# Patient Record
Sex: Female | Born: 1950 | Race: White | Hispanic: No | Marital: Single | Smoking: Never smoker
Health system: Southern US, Community
[De-identification: ages and names within clinical notes are randomized; demographics above are authoritative.]

## PROBLEM LIST (undated history)

## (undated) DIAGNOSIS — R112 Nausea with vomiting, unspecified: Secondary | ICD-10-CM

## (undated) DIAGNOSIS — K219 Gastro-esophageal reflux disease without esophagitis: Secondary | ICD-10-CM

## (undated) DIAGNOSIS — Z9889 Other specified postprocedural states: Secondary | ICD-10-CM

## (undated) DIAGNOSIS — C50919 Malignant neoplasm of unspecified site of unspecified female breast: Secondary | ICD-10-CM

## (undated) DIAGNOSIS — M1990 Unspecified osteoarthritis, unspecified site: Secondary | ICD-10-CM

## (undated) DIAGNOSIS — Z8489 Family history of other specified conditions: Secondary | ICD-10-CM

## (undated) HISTORY — PX: COLONOSCOPY: SHX174

## (undated) HISTORY — PX: OTHER SURGICAL HISTORY: SHX169

---
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
## 2000-02-11 DIAGNOSIS — C50919 Malignant neoplasm of unspecified site of unspecified female breast: Secondary | ICD-10-CM

## 2000-02-11 HISTORY — PX: MASTECTOMY: SHX3

## 2000-02-11 HISTORY — DX: Malignant neoplasm of unspecified site of unspecified female breast: C50.919

## 2002-02-10 HISTORY — PX: OOPHORECTOMY: SHX86

## 2002-08-25 ENCOUNTER — Ambulatory Visit (HOSPITAL_COMMUNITY): Admission: RE | Admit: 2002-08-25 | Discharge: 2002-08-25 | Payer: Self-pay | Admitting: *Deleted

## 2004-03-19 ENCOUNTER — Other Ambulatory Visit: Admission: RE | Admit: 2004-03-19 | Discharge: 2004-03-19 | Payer: Self-pay | Admitting: Obstetrics and Gynecology

## 2011-06-04 ENCOUNTER — Emergency Department (INDEPENDENT_AMBULATORY_CARE_PROVIDER_SITE_OTHER): Payer: BC Managed Care – PPO

## 2011-06-04 ENCOUNTER — Emergency Department (HOSPITAL_BASED_OUTPATIENT_CLINIC_OR_DEPARTMENT_OTHER)
Admission: EM | Admit: 2011-06-04 | Discharge: 2011-06-04 | Disposition: A | Discharge status: Home / Self Care | Payer: BC Managed Care – PPO | Attending: Emergency Medicine | Admitting: Emergency Medicine

## 2011-06-04 ENCOUNTER — Encounter (HOSPITAL_BASED_OUTPATIENT_CLINIC_OR_DEPARTMENT_OTHER): Payer: Self-pay | Admitting: Emergency Medicine

## 2011-06-04 DIAGNOSIS — W219XXA Striking against or struck by unspecified sports equipment, initial encounter: Secondary | ICD-10-CM | POA: Insufficient documentation

## 2011-06-04 DIAGNOSIS — Z853 Personal history of malignant neoplasm of breast: Secondary | ICD-10-CM | POA: Insufficient documentation

## 2011-06-04 DIAGNOSIS — Z79899 Other long term (current) drug therapy: Secondary | ICD-10-CM | POA: Insufficient documentation

## 2011-06-04 DIAGNOSIS — S8990XA Unspecified injury of unspecified lower leg, initial encounter: Secondary | ICD-10-CM

## 2011-06-04 DIAGNOSIS — S8010XA Contusion of unspecified lower leg, initial encounter: Secondary | ICD-10-CM

## 2011-06-04 DIAGNOSIS — Y9364 Activity, baseball: Secondary | ICD-10-CM | POA: Insufficient documentation

## 2011-06-04 DIAGNOSIS — S99919A Unspecified injury of unspecified ankle, initial encounter: Secondary | ICD-10-CM

## 2011-06-04 DIAGNOSIS — X58XXXA Exposure to other specified factors, initial encounter: Secondary | ICD-10-CM

## 2011-06-04 DIAGNOSIS — M7989 Other specified soft tissue disorders: Secondary | ICD-10-CM

## 2011-06-04 DIAGNOSIS — Y9239 Other specified sports and athletic area as the place of occurrence of the external cause: Secondary | ICD-10-CM | POA: Insufficient documentation

## 2011-06-04 HISTORY — DX: Malignant neoplasm of unspecified site of unspecified female breast: C50.919

## 2011-06-04 NOTE — ED Notes (Signed)
Pt has right lower leg pain after being hit with a hit soft ball in shin. Pt has had ice on leg

## 2011-06-04 NOTE — Discharge Instructions (Signed)
Contusion A contusion is a deep bruise. Contusions happen when an injury causes bleeding under the skin. Signs of bruising include pain, puffiness (swelling), and discolored skin. The contusion may turn blue, purple, or yellow. HOME CARE   Put ice on the injured area.   Put ice in a plastic bag.   Place a towel between your skin and the bag.   Leave the ice on for 15 to 20 minutes, 3 to 4 times a day.   Only take medicine as told by your doctor.   Rest the injured area.   If possible, raise (elevate) the injured area to lessen puffiness.  GET HELP RIGHT AWAY IF:   You have more bruising or puffiness.   You have pain that is getting worse.   Your puffiness or pain is not helped by medicine.  MAKE SURE YOU:   Understand these instructions.   Will watch your condition.   Will get help right away if you are not doing well or get worse.  Document Released: 07/16/2007 Document Revised: 01/16/2011 Document Reviewed: 12/02/2010 Exit[employer] Patient Information 2012 [employer].Hematoma A hematoma is a pocket of blood that collects under the skin, in an organ, in a body space, in a joint space, or in other tissue. The blood can clot to form a lump that you can see and feel. The lump is often firm, sore, and sometimes even painful and tender. Most hematomas get better in a few days to weeks. However, some hematomas may be serious and require medical care.Hematomas can range in size from very small to very large. CAUSES  A hematoma can be caused by a blunt or penetrating injury. It can also be caused by leakage from a blood vessel under the skin. Spontaneous leakage from a blood vessel is more likely to occur in elderly people, especially those taking blood thinners. Sometimes, a hematoma can develop after certain medical procedures. SYMPTOMS  Unlike a bruise, a hematoma forms a firm lump that you can feel. This lump is the collection of blood. The collection of blood can also cause your  skin to turn a blue to dark blue color. If the hematoma is close to the surface of the skin, it often produces a yellowish color in the skin. DIAGNOSIS  Your caregiver can determine whether you have a hematoma based on your history and a physical exam. TREATMENT  Hematomas usually go away on their own over time. Rarely does the blood need to be drained out of the body. HOME CARE INSTRUCTIONS   Put ice on the injured area.   Put ice in a plastic bag.   Place a towel between your skin and the bag.   Leave the ice on for 15 to 20 minutes, 3 to 4 times a day for the first 1 to 2 days.   After the first 2 days, switch to using warm compresses on the hematoma.   Elevate the injured area to help decrease pain and swelling. Wrapping the area with an elastic bandage may also be helpful. Compression helps to reduce swelling and promotes shrinking of the hematoma. Make sure the bandage is not wrapped too tight.   If your hematoma is on a lower extremity and is painful, crutches may be helpful for a couple days.   Only take over-the-counter or prescription medicines for pain, discomfort, or fever as directed by your caregiver. Most patients can take acetaminophen or ibuprofen for the pain.  SEEK IMMEDIATE MEDICAL CARE IF:   You   have increasing pain, or your pain is not controlled with medicine.   You have a fever.   You have worsening swelling or discoloration.   Your skin over the hematoma breaks or starts bleeding.  MAKE SURE YOU:   Understand these instructions.   Will watch your condition.   Will get help right away if you are not doing well or get worse.  Document Released: 09/11/2003 Document Revised: 01/16/2011 Document Reviewed: 09/30/2010 Exit[employer] Patient Information 2012 [employer]. 

## 2011-06-04 NOTE — ED Notes (Signed)
Playing softball this afternoon, was pitching and line drive hit her right shin, +dp, worried about getting blood clot

## 2011-06-04 NOTE — ED Provider Notes (Signed)
History     CSN: [id number]  Arrival date & time 06/04/11  2013   None     Chief Complaint  Patient presents with  . Leg Pain    (Consider location/radiation/quality/duration/timing/severity/associated sxs/prior treatment) Patient is a 61 y.o. female presenting with leg pain. The history is provided by the patient. No language interpreter was used.  Leg Pain  The incident occurred 1 to 2 hours ago. [place]. The injury mechanism was a direct blow. The pain is present in the right leg. The quality of the pain is described as aching. The pain is at a severity of 3/10. The pain is mild. The pain has been constant since onset. Pertinent negatives include no numbness and no loss of motion. She reports no foreign bodies present. The symptoms are aggravated by nothing. She has tried ice for the symptoms.   Pt was hit in right lower leg with a softball.  Pt complains of bruising and pain.  (Pt worried she could have a blood clot)  Past Medical History  Diagnosis Date  . Breast CA     Past Surgical History  Procedure Date  . Mastectomy   . Knee surgery   . Oophorectomy     No family history on file.  History  Substance Use Topics  . Smoking status: Never Smoker   . Smokeless tobacco: Not on file  . Alcohol Use: No    OB History    Grav Para Term Preterm Abortions TAB SAB Ect Mult Living                  Review of Systems  Musculoskeletal: Positive for myalgias and joint swelling.  Neurological: Negative for numbness.  All other systems reviewed and are negative.    Allergies  Codeine and Tape  Home Medications   Current Outpatient Rx  Name Route Sig Dispense Refill  . ASPIRIN-ACETAMINOPHEN-CAFFEINE 250-250-65 MG PO TABS Oral Take 1 tablet by mouth every 6 (six) hours as needed. Patient used this medication for her headache and knee pain.    Marland Kitchen CALCIUM + D PO Oral Take 1 tablet by mouth daily.    Marland Kitchen CALCIUM-VITAMIN D PO Oral Take 1 tablet by  mouth daily.    . CELECOXIB 50 MG PO CAPS Oral Take 50 mg by mouth 2 (two) times daily.    . IBUPROFEN 200 MG PO TABS Oral Take 200 mg by mouth every 6 (six) hours as needed. Patient used this medication for her knee.    Marland Kitchen PRILOSEC PO Oral Take 1 tablet by mouth daily.      BP 132/76  Pulse 78  Temp(Src) 98.3 F (36.8 C) (Oral)  Resp 18  Ht 5\' 8"  (1.727 m)  Wt 190 lb (86.183 kg)  BMI 28.89 kg/m2  SpO2 98%  Physical Exam  Nursing note and vitals reviewed. Constitutional: She is oriented to person, place, and time. She appears well-developed and well-nourished.  HENT:  Head: Normocephalic and atraumatic.  Eyes: Pupils are equal, round, and reactive to light.  Musculoskeletal: Normal range of motion. She exhibits tenderness.       10x15 cm bruised swollen area right lower leg,  From,  nv and ns intact  Neurological: She is alert and oriented to person, place, and time.  Skin: Skin is warm.  Psychiatric: She has a normal mood and affect.    ED Course  Procedures (including critical care time)  Labs Reviewed - No data to display  Dg Tibia/fibula Right  06/04/2011  *RADIOLOGY REPORT*  Clinical Data: Blunt trauma to the lower extremity.  Swelling and bruising.  RIGHT TIBIA AND FIBULA - 2 VIEW  Comparison: None.  Findings: Soft tissue swelling is present over the proximal tibia anteriorly.  There is no underlying fracture.  No radiopaque foreign body is evident.  Advanced degenerative changes are noted in the knee.  The suprapatellar bursa is not imaged and I therefore cannot describe if there is an effusion.  The ankle is located.  IMPRESSION:  1.  Soft tissue swelling over the anterior proximal tibia likely represents a hematoma without underlying fracture. 2.  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  Original Report Authenticated By: Jamesetta Orleans. MATTERN, M.D.     No diagnosis found.    MDM  Pt placed in an ace wrap.  Pt advised ice.  Return if any  problems.          Lonia Skinner [place], [place] 06/04/11 2231

## 2011-06-05 NOTE — ED Provider Notes (Signed)
Medical screening examination/treatment/procedure(s) were performed by non-physician practitioner and as supervising physician I was immediately available for consultation/collaboration.   Carleene Cooper III, MD 06/05/11 1153

## 2014-10-06 ENCOUNTER — Other Ambulatory Visit (HOSPITAL_COMMUNITY): Payer: Self-pay | Admitting: Orthopaedic Surgery

## 2014-10-10 ENCOUNTER — Other Ambulatory Visit (HOSPITAL_COMMUNITY): Payer: Self-pay | Admitting: *Deleted

## 2014-10-10 NOTE — Patient Instructions (Addendum)
Lindsay Jimenez  10/10/2014   Your procedure is scheduled on: Friday September 9th, 2016  Report to [facility]  Entrance take [place]  elevators to 3rd floor to  [name] at 750 AM.  Call this number if you have problems the morning of surgery [phone]   Remember: ONLY 1 PERSON MAY GO WITH YOU TO SHORT STAY TO GET  READY MORNING OF [name].  Do not eat food or drink liquids :After Midnight.     Take these medicines the morning of surgery with A SIP OF WATER flonase nasal spray, eye drop if needed                               You may not have any metal on your body including hair pins and              piercings  Do not wear jewelry, make-up, lotions, powders or perfumes, deodorant             Do not wear nail polish.  Do not shave  48 hours prior to surgery.              Men may shave face and neck.   Do not bring valuables to the hospital. [name].  Contacts, dentures or bridgework may not be worn into surgery.  Leave suitcase in the car. After surgery it may be brought to your room.     Patients discharged the day of surgery will not be allowed to drive home.  Name and phone number of your driver:  Special Instructions: N/A              Please read over the following fact sheets you were given: _____________________________________________________________________             [name] - Preparing for Surgery Before surgery, you can play an important role.  Because skin is not sterile, your skin needs to be as free of germs as possible.  You can reduce the number of germs on your skin by washing with CHG (chlorahexidine gluconate) soap before surgery.  CHG is an antiseptic cleaner which kills germs and bonds with the skin to continue killing germs even after washing. Please DO NOT use if you have an allergy to CHG or antibacterial soaps.  If your skin becomes reddened/irritated stop  using the CHG and inform your nurse when you arrive at Short Stay. Do not shave (including legs and underarms) for at least 48 hours prior to the first CHG shower.  You may shave your face/neck. Please follow these instructions carefully:  1.  Shower with CHG Soap the night before surgery and the  morning of Surgery.  2.  If you choose to wash your hair, wash your hair first as usual with your  normal  shampoo.  3.  After you shampoo, rinse your hair and body thoroughly to remove the  shampoo.                           4.  Use CHG as you would any other liquid soap.  You can apply chg directly  to the skin and wash  Gently with a scrungie or clean washcloth.  5.  Apply the CHG Soap to your body ONLY FROM THE NECK DOWN.   Do not use on face/ open                           Wound or open sores. Avoid contact with eyes, ears mouth and genitals (private parts).                       Wash face,  Genitals (private parts) with your normal soap.             6.  Wash thoroughly, paying special attention to the area where your surgery  will be performed.  7.  Thoroughly rinse your body with warm water from the neck down.  8.  DO NOT shower/wash with your normal soap after using and rinsing off  the CHG Soap.                9.  Pat yourself dry with a clean towel.            10.  Wear clean pajamas.            11.  Place clean sheets on your bed the night of your first shower and do not  sleep with pets. Day of Surgery : Do not apply any lotions/deodorants the morning of surgery.  Please wear clean clothes to the hospital/surgery center.  FAILURE TO FOLLOW THESE INSTRUCTIONS MAY RESULT IN THE CANCELLATION OF YOUR SURGERY PATIENT SIGNATURE_________________________________  NURSE SIGNATURE__________________________________  ________________________________________________________________________  WHAT IS A BLOOD TRANSFUSION? Blood Transfusion Information  A transfusion is the  replacement of blood or some of its parts. Blood is made up of multiple cells which provide different functions.  Red blood cells carry oxygen and are used for blood loss replacement.  White blood cells fight against infection.  Platelets control bleeding.  Plasma helps clot blood.  Other blood products are available for specialized needs, such as hemophilia or other clotting disorders. BEFORE THE TRANSFUSION  Who gives blood for transfusions?   Healthy volunteers who are fully evaluated to make sure their blood is safe. This is blood bank blood. Transfusion therapy is the safest it has ever been in the practice of medicine. Before blood is taken from a donor, a complete history is taken to make sure that person has no history of diseases nor engages in risky social behavior (examples are intravenous drug use or sexual activity with multiple partners). The donor's travel history is screened to minimize risk of transmitting infections, such as malaria. The donated blood is tested for signs of infectious diseases, such as HIV and hepatitis. The blood is then tested to be sure it is compatible with you in order to minimize the chance of a transfusion reaction. If you or a relative donates blood, this is often done in anticipation of surgery and is not appropriate for emergency situations. It takes many days to process the donated blood. RISKS AND COMPLICATIONS Although transfusion therapy is very safe and saves many lives, the main dangers of transfusion include:   Getting an infectious disease.  Developing a transfusion reaction. This is an allergic reaction to something in the blood you were given. Every precaution is taken to prevent this. The decision to have a blood transfusion has been considered carefully by your caregiver before blood is given. Blood is not given unless the benefits outweigh  the risks. AFTER THE TRANSFUSION  Right after receiving a blood transfusion, you will usually  feel much better and more energetic. This is especially true if your red blood cells have gotten low (anemic). The transfusion raises the level of the red blood cells which carry oxygen, and this usually causes an energy increase.  The nurse administering the transfusion will monitor you carefully for complications. HOME CARE INSTRUCTIONS  No special instructions are needed after a transfusion. You may find your energy is better. Speak with your caregiver about any limitations on activity for underlying diseases you may have. SEEK MEDICAL CARE IF:   Your condition is not improving after your transfusion.  You develop redness or irritation at the intravenous (IV) site. SEEK IMMEDIATE MEDICAL CARE IF:  Any of the following symptoms occur over the next 12 hours:  Shaking chills.  You have a temperature by mouth above 102 F (38.9 C), not controlled by medicine.  Chest, back, or muscle pain.  People around you feel you are not acting correctly or are confused.  Shortness of breath or difficulty breathing.  Dizziness and fainting.  You get a rash or develop hives.  You have a decrease in urine output.  Your urine turns a dark color or changes to pink, red, or brown. Any of the following symptoms occur over the next 10 days:  You have a temperature by mouth above 102 F (38.9 C), not controlled by medicine.  Shortness of breath.  Weakness after normal activity.  The white part of the eye turns yellow (jaundice).  You have a decrease in the amount of urine or are urinating less often.  Your urine turns a dark color or changes to pink, red, or brown. Document Released: 01/25/2000 Document Revised: 04/21/2011 Document Reviewed: 09/13/2007 [facility] Patient Information 2014 [place], [place].  _______________________________________________________________________

## 2014-10-11 ENCOUNTER — Encounter (HOSPITAL_COMMUNITY): Payer: Self-pay

## 2014-10-11 ENCOUNTER — Encounter (HOSPITAL_COMMUNITY)
Admission: RE | Admit: 2014-10-11 | Discharge: 2014-10-11 | Disposition: A | Discharge status: Home / Self Care | Payer: BC Managed Care – PPO | Source: Ambulatory Visit | Attending: Orthopaedic Surgery | Admitting: Orthopaedic Surgery

## 2014-10-11 DIAGNOSIS — Z01818 Encounter for other preprocedural examination: Secondary | ICD-10-CM | POA: Insufficient documentation

## 2014-10-11 DIAGNOSIS — M1611 Unilateral primary osteoarthritis, right hip: Secondary | ICD-10-CM | POA: Insufficient documentation

## 2014-10-11 HISTORY — DX: Other specified postprocedural states: Z98.890

## 2014-10-11 HISTORY — DX: Gastro-esophageal reflux disease without esophagitis: K21.9

## 2014-10-11 HISTORY — DX: Other specified postprocedural states: R11.2

## 2014-10-11 HISTORY — DX: Family history of other specified conditions: Z84.89

## 2014-10-11 HISTORY — DX: Unspecified osteoarthritis, unspecified site: M19.90

## 2014-10-11 LAB — CBC
HEMATOCRIT: 42.6 % (ref 36.0–46.0)
Hemoglobin: 14.1 g/dL (ref 12.0–15.0)
MCH: 29.6 pg (ref 26.0–34.0)
MCHC: 33.1 g/dL (ref 30.0–36.0)
MCV: 89.3 fL (ref 78.0–100.0)
Platelets: 285 10*3/uL (ref 150–400)
RBC: 4.77 MIL/uL (ref 3.87–5.11)
RDW: 13.1 % (ref 11.5–15.5)
WBC: 8.6 10*3/uL (ref 4.0–10.5)

## 2014-10-11 LAB — BASIC METABOLIC PANEL
Anion gap: 4 — ABNORMAL LOW (ref 5–15)
BUN: 20 mg/dL (ref 6–20)
CO2: 32 mmol/L (ref 22–32)
CREATININE: 0.81 mg/dL (ref 0.44–1.00)
Calcium: 9.9 mg/dL (ref 8.9–10.3)
Chloride: 102 mmol/L (ref 101–111)
GFR calc Af Amer: >60 mL/min (ref >60)
GLUCOSE: 88 mg/dL (ref 65–99)
POTASSIUM: 5.2 mmol/L — AB (ref 3.5–5.1)
Sodium: 138 mmol/L (ref 135–145)

## 2014-10-11 LAB — PROTIME-INR
INR: 0.98 (ref 0.00–1.49)
PROTHROMBIN TIME: 13.2 s (ref 11.6–15.2)

## 2014-10-11 LAB — ABO/RH: ABO/RH(D): O POS

## 2014-10-11 LAB — APTT: aPTT: 29 seconds (ref 24–37)

## 2014-10-11 LAB — SURGICAL PCR SCREEN
MRSA, PCR: NEGATIVE
Staphylococcus aureus: NEGATIVE

## 2014-10-19 LAB — TYPE AND SCREEN
ABO/RH(D): O POS
Antibody Screen: NEGATIVE

## 2014-10-20 ENCOUNTER — Inpatient Hospital Stay (HOSPITAL_COMMUNITY): Payer: BC Managed Care – PPO

## 2014-10-20 ENCOUNTER — Inpatient Hospital Stay (HOSPITAL_COMMUNITY)
Admission: RE | Admit: 2014-10-20 | Discharge: 2014-10-21 | DRG: 470 | Disposition: A | Discharge status: Home Health | Payer: BC Managed Care – PPO | Source: Ambulatory Visit | Attending: Orthopaedic Surgery | Admitting: Orthopaedic Surgery

## 2014-10-20 ENCOUNTER — Encounter (HOSPITAL_COMMUNITY): Payer: Self-pay | Admitting: *Deleted

## 2014-10-20 ENCOUNTER — Inpatient Hospital Stay (HOSPITAL_COMMUNITY): Payer: BC Managed Care – PPO | Admitting: Certified Registered Nurse Anesthetist

## 2014-10-20 ENCOUNTER — Encounter (HOSPITAL_COMMUNITY)
Admission: RE | Disposition: A | Discharge status: Home Health | Payer: Self-pay | Source: Ambulatory Visit | Attending: Orthopaedic Surgery

## 2014-10-20 DIAGNOSIS — Z9010 Acquired absence of unspecified breast and nipple: Secondary | ICD-10-CM | POA: Diagnosis present

## 2014-10-20 DIAGNOSIS — M25551 Pain in right hip: Secondary | ICD-10-CM | POA: Diagnosis present

## 2014-10-20 DIAGNOSIS — Z96641 Presence of right artificial hip joint: Secondary | ICD-10-CM

## 2014-10-20 DIAGNOSIS — M1611 Unilateral primary osteoarthritis, right hip: Principal | ICD-10-CM

## 2014-10-20 DIAGNOSIS — Z853 Personal history of malignant neoplasm of breast: Secondary | ICD-10-CM | POA: Diagnosis not present

## 2014-10-20 DIAGNOSIS — Z01812 Encounter for preprocedural laboratory examination: Secondary | ICD-10-CM

## 2014-10-20 DIAGNOSIS — Z419 Encounter for procedure for purposes other than remedying health state, unspecified: Secondary | ICD-10-CM

## 2014-10-20 DIAGNOSIS — K219 Gastro-esophageal reflux disease without esophagitis: Secondary | ICD-10-CM | POA: Diagnosis present

## 2014-10-20 HISTORY — PX: TOTAL HIP ARTHROPLASTY: SHX124

## 2014-10-20 SURGERY — ARTHROPLASTY, HIP, TOTAL, ANTERIOR APPROACH
Anesthesia: Monitor Anesthesia Care | Site: Hip | Laterality: Right

## 2014-10-20 MED ORDER — SODIUM CHLORIDE 0.9 % IV SOLN: INTRAVENOUS | Status: DC

## 2014-10-20 MED ORDER — POLYETHYLENE GLYCOL 3350 17 G PO PACK: 17.0000 g | PACK | Freq: Every day | ORAL | Status: DC | PRN

## 2014-10-20 MED ORDER — ONDANSETRON HCL 4 MG PO TABS: 4.0000 mg | ORAL_TABLET | Freq: Four times a day (QID) | ORAL | Status: DC | PRN

## 2014-10-20 MED ORDER — METOCLOPRAMIDE HCL 5 MG/ML IJ SOLN: 5.0000 mg | Freq: Three times a day (TID) | INTRAMUSCULAR | Status: DC | PRN

## 2014-10-20 MED ORDER — PROPOFOL 10 MG/ML IV BOLUS
INTRAVENOUS | Status: AC
  Filled 2014-10-20: qty 20

## 2014-10-20 MED ORDER — OXYCODONE HCL 5 MG PO TABS: 5.0000 mg | ORAL_TABLET | Freq: Once | ORAL | Status: DC | PRN

## 2014-10-20 MED ORDER — ACETAMINOPHEN 325 MG PO TABS: 650.0000 mg | ORAL_TABLET | Freq: Four times a day (QID) | ORAL | Status: DC | PRN

## 2014-10-20 MED ORDER — METHOCARBAMOL 500 MG PO TABS
500.0000 mg | ORAL_TABLET | Freq: Four times a day (QID) | ORAL | Status: DC | PRN
  Administered 2014-10-21: 500 mg via ORAL
  Filled 2014-10-20: qty 1

## 2014-10-20 MED ORDER — CEFAZOLIN SODIUM-DEXTROSE 2-3 GM-% IV SOLR
2.0000 g | INTRAVENOUS | Status: AC
  Administered 2014-10-20: 2 g via INTRAVENOUS

## 2014-10-20 MED ORDER — SODIUM CHLORIDE 0.9 % IR SOLN
Status: DC | PRN
  Administered 2014-10-20: 1000 mL

## 2014-10-20 MED ORDER — DIPHENHYDRAMINE HCL 12.5 MG/5ML PO ELIX: 12.5000 mg | ORAL_SOLUTION | ORAL | Status: DC | PRN

## 2014-10-20 MED ORDER — ONDANSETRON HCL 4 MG/2ML IJ SOLN: 4.0000 mg | Freq: Four times a day (QID) | INTRAMUSCULAR | Status: DC | PRN

## 2014-10-20 MED ORDER — ASPIRIN EC 325 MG PO TBEC
325.0000 mg | DELAYED_RELEASE_TABLET | Freq: Two times a day (BID) | ORAL | Status: DC
  Administered 2014-10-21: 325 mg via ORAL
  Filled 2014-10-20 (×3): qty 1

## 2014-10-20 MED ORDER — 0.9 % SODIUM CHLORIDE (POUR BTL) OPTIME
TOPICAL | Status: DC | PRN
  Administered 2014-10-20: 1000 mL

## 2014-10-20 MED ORDER — FENTANYL CITRATE (PF) 100 MCG/2ML IJ SOLN
INTRAMUSCULAR | Status: DC | PRN
  Administered 2014-10-20 (×2): 50 ug via INTRAVENOUS

## 2014-10-20 MED ORDER — MIDAZOLAM HCL 5 MG/5ML IJ SOLN
INTRAMUSCULAR | Status: DC | PRN
  Administered 2014-10-20: 2 mg via INTRAVENOUS

## 2014-10-20 MED ORDER — HYDROMORPHONE HCL 1 MG/ML IJ SOLN
1.0000 mg | INTRAMUSCULAR | Status: DC | PRN
Start: 2014-10-20 — End: 2014-10-21

## 2014-10-20 MED ORDER — ACETAMINOPHEN 650 MG RE SUPP: 650.0000 mg | Freq: Four times a day (QID) | RECTAL | Status: DC | PRN

## 2014-10-20 MED ORDER — ONDANSETRON HCL 4 MG/2ML IJ SOLN
INTRAMUSCULAR | Status: DC | PRN
  Administered 2014-10-20: 4 mg via INTRAVENOUS

## 2014-10-20 MED ORDER — CEFAZOLIN SODIUM 1-5 GM-% IV SOLN
1.0000 g | Freq: Four times a day (QID) | INTRAVENOUS | Status: AC
  Administered 2014-10-20 (×2): 1 g via INTRAVENOUS
  Filled 2014-10-20 (×2): qty 50

## 2014-10-20 MED ORDER — HYDROMORPHONE HCL 1 MG/ML IJ SOLN
0.2500 mg | INTRAMUSCULAR | Status: DC | PRN
  Administered 2014-10-20 (×4): 0.5 mg via INTRAVENOUS

## 2014-10-20 MED ORDER — METHOCARBAMOL 1000 MG/10ML IJ SOLN
500.0000 mg | Freq: Four times a day (QID) | INTRAVENOUS | Status: DC | PRN
  Administered 2014-10-20 (×2): 500 mg via INTRAVENOUS
  Filled 2014-10-20 (×3): qty 5

## 2014-10-20 MED ORDER — MENTHOL 3 MG MT LOZG
1.0000 | LOZENGE | OROMUCOSAL | Status: DC | PRN
  Filled 2014-10-20: qty 9

## 2014-10-20 MED ORDER — HYDROMORPHONE HCL 1 MG/ML IJ SOLN
0.2500 mg | INTRAMUSCULAR | Status: DC | PRN
  Administered 2014-10-20: 0.5 mg via INTRAVENOUS

## 2014-10-20 MED ORDER — BUPIVACAINE HCL (PF) 0.5 % IJ SOLN
INTRAMUSCULAR | Status: DC | PRN
  Administered 2014-10-20: 3 mL

## 2014-10-20 MED ORDER — TRAMADOL HCL 50 MG PO TABS: 100.0000 mg | ORAL_TABLET | Freq: Four times a day (QID) | ORAL | Status: DC | PRN

## 2014-10-20 MED ORDER — LACTATED RINGERS IV SOLN
INTRAVENOUS | Status: DC
  Administered 2014-10-20: 11:00:00 via INTRAVENOUS
  Administered 2014-10-20: 1000 mL via INTRAVENOUS

## 2014-10-20 MED ORDER — DEXAMETHASONE SODIUM PHOSPHATE 10 MG/ML IJ SOLN
INTRAMUSCULAR | Status: DC | PRN
  Administered 2014-10-20: 10 mg via INTRAVENOUS

## 2014-10-20 MED ORDER — KETAMINE HCL 10 MG/ML IJ SOLN
INTRAMUSCULAR | Status: DC | PRN
  Administered 2014-10-20: 10 mg via INTRAVENOUS
  Administered 2014-10-20: 20 mg via INTRAVENOUS
  Administered 2014-10-20: 10 mg via INTRAVENOUS
  Administered 2014-10-20: 20 mg via INTRAVENOUS

## 2014-10-20 MED ORDER — BUPIVACAINE HCL (PF) 0.5 % IJ SOLN
INTRAMUSCULAR | Status: AC
  Filled 2014-10-20: qty 30

## 2014-10-20 MED ORDER — PROPOFOL 10 MG/ML IV BOLUS
INTRAVENOUS | Status: DC | PRN
  Administered 2014-10-20: 20 mg via INTRAVENOUS

## 2014-10-20 MED ORDER — PHENOL 1.4 % MT LIQD
1.0000 | OROMUCOSAL | Status: DC | PRN
  Filled 2014-10-20: qty 177

## 2014-10-20 MED ORDER — ONDANSETRON HCL 4 MG/2ML IJ SOLN
INTRAMUSCULAR | Status: AC
  Filled 2014-10-20: qty 2

## 2014-10-20 MED ORDER — DOCUSATE SODIUM 100 MG PO CAPS
100.0000 mg | ORAL_CAPSULE | Freq: Two times a day (BID) | ORAL | Status: DC
  Administered 2014-10-20 – 2014-10-21 (×2): 100 mg via ORAL
  Filled 2014-10-20 (×2): qty 1

## 2014-10-20 MED ORDER — KETAMINE HCL 10 MG/ML IJ SOLN
INTRAMUSCULAR | Status: AC
  Filled 2014-10-20: qty 1

## 2014-10-20 MED ORDER — OXYCODONE HCL 5 MG PO TABS
5.0000 mg | ORAL_TABLET | ORAL | Status: DC | PRN
  Administered 2014-10-20 – 2014-10-21 (×2): 5 mg via ORAL
  Filled 2014-10-20 (×3): qty 1

## 2014-10-20 MED ORDER — PHENYLEPHRINE 40 MCG/ML (10ML) SYRINGE FOR IV PUSH (FOR BLOOD PRESSURE SUPPORT)
PREFILLED_SYRINGE | INTRAVENOUS | Status: AC
  Filled 2014-10-20: qty 10

## 2014-10-20 MED ORDER — MIDAZOLAM HCL 2 MG/2ML IJ SOLN
INTRAMUSCULAR | Status: AC
  Filled 2014-10-20: qty 4

## 2014-10-20 MED ORDER — ALUM & MAG HYDROXIDE-SIMETH 200-200-20 MG/5ML PO SUSP: 30.0000 mL | ORAL | Status: DC | PRN

## 2014-10-20 MED ORDER — FENTANYL CITRATE (PF) 100 MCG/2ML IJ SOLN
INTRAMUSCULAR | Status: AC
  Filled 2014-10-20: qty 4

## 2014-10-20 MED ORDER — PROPOFOL INFUSION 10 MG/ML OPTIME
INTRAVENOUS | Status: DC | PRN
  Administered 2014-10-20: 50 ug/kg/min via INTRAVENOUS

## 2014-10-20 MED ORDER — TRANEXAMIC ACID 1000 MG/10ML IV SOLN
1000.0000 mg | INTRAVENOUS | Status: AC
  Administered 2014-10-20: 1000 mg via INTRAVENOUS
  Filled 2014-10-20: qty 10

## 2014-10-20 MED ORDER — KETOROLAC TROMETHAMINE 15 MG/ML IJ SOLN
7.5000 mg | Freq: Four times a day (QID) | INTRAMUSCULAR | Status: AC
  Administered 2014-10-20 – 2014-10-21 (×4): 7.5 mg via INTRAVENOUS
  Filled 2014-10-20 (×3): qty 1

## 2014-10-20 MED ORDER — METOCLOPRAMIDE HCL 10 MG PO TABS: 5.0000 mg | ORAL_TABLET | Freq: Three times a day (TID) | ORAL | Status: DC | PRN

## 2014-10-20 MED ORDER — OXYCODONE HCL 5 MG/5ML PO SOLN
5.0000 mg | Freq: Once | ORAL | Status: DC | PRN
  Filled 2014-10-20: qty 5

## 2014-10-20 MED ORDER — HYDROMORPHONE HCL 1 MG/ML IJ SOLN
INTRAMUSCULAR | Status: AC
  Filled 2014-10-20: qty 2

## 2014-10-20 MED ORDER — KETOROLAC TROMETHAMINE 15 MG/ML IJ SOLN
INTRAMUSCULAR | Status: AC
Start: 2014-10-20 — End: 2014-10-21
  Filled 2014-10-20: qty 1

## 2014-10-20 MED ORDER — ZOLPIDEM TARTRATE 5 MG PO TABS: 5.0000 mg | ORAL_TABLET | Freq: Every evening | ORAL | Status: DC | PRN

## 2014-10-20 MED ORDER — CEFAZOLIN SODIUM-DEXTROSE 2-3 GM-% IV SOLR
INTRAVENOUS | Status: AC
  Filled 2014-10-20: qty 50

## 2014-10-20 MED ORDER — HYDROMORPHONE HCL 1 MG/ML IJ SOLN
INTRAMUSCULAR | Status: AC
  Filled 2014-10-20: qty 1

## 2014-10-20 SURGICAL SUPPLY — 45 items
APL SKNCLS STERI-STRIP NONHPOA (GAUZE/BANDAGES/DRESSINGS) ×1
BAG SPEC THK2 15X12 ZIP CLS (MISCELLANEOUS)
BAG ZIPLOCK 12X15 (MISCELLANEOUS) IMPLANT
BENZOIN TINCTURE PRP APPL 2/3 (GAUZE/BANDAGES/DRESSINGS) ×1 IMPLANT
BLADE SAW SGTL 18X1.27X75 (BLADE) ×2 IMPLANT
CAPT HIP TOTAL 2 ×1 IMPLANT
CELLS DAT CNTRL 66122 CELL SVR (MISCELLANEOUS) ×1 IMPLANT
CHLORAPREP W/TINT 26ML (MISCELLANEOUS) ×2 IMPLANT
COVER PERINEAL POST (MISCELLANEOUS) ×2 IMPLANT
DRAPE C-ARM 42X120 X-RAY (DRAPES) ×2 IMPLANT
DRAPE STERI IOBAN 125X83 (DRAPES) ×2 IMPLANT
DRAPE U-SHAPE 47X51 STRL (DRAPES) ×6 IMPLANT
DRSG AQUACEL AG ADV 3.5X10 (GAUZE/BANDAGES/DRESSINGS) ×2 IMPLANT
ELECT BLADE TIP CTD 4 INCH (ELECTRODE) ×2 IMPLANT
ELECT REM PT RETURN 9FT ADLT (ELECTROSURGICAL) ×2
ELECTRODE REM PT RTRN 9FT ADLT (ELECTROSURGICAL) ×1 IMPLANT
FACESHIELD WRAPAROUND (MASK) ×8 IMPLANT
FACESHIELD WRAPAROUND OR TEAM (MASK) ×4 IMPLANT
GAUZE XEROFORM 1X8 LF (GAUZE/BANDAGES/DRESSINGS) IMPLANT
GLOVE BIO SURGEON STRL SZ7.5 (GLOVE) ×2 IMPLANT
GLOVE BIOGEL PI IND STRL 8 (GLOVE) ×2 IMPLANT
GLOVE BIOGEL PI INDICATOR 8 (GLOVE) ×2
GLOVE ECLIPSE 8.0 STRL XLNG CF (GLOVE) ×2 IMPLANT
GOWN STRL REUS W/TWL XL LVL3 (GOWN DISPOSABLE) ×4 IMPLANT
HANDPIECE INTERPULSE COAX TIP (DISPOSABLE) ×2
KIT BASIN OR (CUSTOM PROCEDURE TRAY) ×2 IMPLANT
PACK TOTAL JOINT (CUSTOM PROCEDURE TRAY) ×2 IMPLANT
PEN SKIN MARKING BROAD (MISCELLANEOUS) ×2 IMPLANT
RETRACTOR WND ALEXIS 18 MED (MISCELLANEOUS) ×1 IMPLANT
RTRCTR WOUND ALEXIS 18CM MED (MISCELLANEOUS) ×2
SCREW 6.5MMX25MM (Screw) ×1 IMPLANT
SCREW PINN CAN 6.5X20 (Screw) ×1 IMPLANT
SET HNDPC FAN SPRY TIP SCT (DISPOSABLE) ×1 IMPLANT
STAPLER VISISTAT 35W (STAPLE) IMPLANT
STRIP CLOSURE SKIN 1/2X4 (GAUZE/BANDAGES/DRESSINGS) ×1 IMPLANT
SUT ETHIBOND NAB CT1 #1 30IN (SUTURE) ×2 IMPLANT
SUT MNCRL AB 4-0 PS2 18 (SUTURE) ×1 IMPLANT
SUT VIC AB 0 CT1 36 (SUTURE) ×2 IMPLANT
SUT VIC AB 1 CT1 36 (SUTURE) ×2 IMPLANT
SUT VIC AB 2-0 CT1 27 (SUTURE) ×4
SUT VIC AB 2-0 CT1 TAPERPNT 27 (SUTURE) ×2 IMPLANT
TOWEL OR 17X26 10 PK STRL BLUE (TOWEL DISPOSABLE) ×2 IMPLANT
TOWEL OR NON WOVEN STRL DISP B (DISPOSABLE) ×2 IMPLANT
TRAY FOLEY METER SIL LF 16FR (CATHETERS) ×1 IMPLANT
YANKAUER SUCT BULB TIP 10FT TU (MISCELLANEOUS) ×2 IMPLANT

## 2014-10-20 NOTE — Anesthesia Preprocedure Evaluation (Signed)
Anesthesia Evaluation  Patient identified by MRN, date of birth, ID band Patient awake    Reviewed: Allergy & Precautions, NPO status , Patient's Chart, lab work & pertinent test results  History of Anesthesia Complications (+) PONV and Family history of anesthesia reaction  Airway Mallampati: II   Neck ROM: full    Dental   Pulmonary neg pulmonary ROS,    breath sounds clear to auscultation       Cardiovascular negative cardio ROS   Rhythm:regular Rate:Normal     Neuro/Psych    GI/Hepatic GERD  ,  Endo/Other    Renal/GU      Musculoskeletal  (+) Arthritis ,   Abdominal   Peds  Hematology   Anesthesia Other Findings   Reproductive/Obstetrics                             Anesthesia Physical Anesthesia Plan  ASA: II  Anesthesia Plan: MAC and Spinal   Post-op Pain Management:    Induction: Intravenous  Airway Management Planned: Simple Face Mask  Additional Equipment:   Intra-op Plan:   Post-operative Plan:   Informed Consent: I have reviewed the patients History and Physical, chart, labs and discussed the procedure including the risks, benefits and alternatives for the proposed anesthesia with the patient or authorized representative who has indicated his/her understanding and acceptance.     Plan Discussed with: CRNA, Anesthesiologist and Surgeon  Anesthesia Plan Comments:         Anesthesia Quick Evaluation

## 2014-10-20 NOTE — Anesthesia Procedure Notes (Signed)
Spinal Patient location during procedure: OR Start time: 10/20/2014 9:52 AM End time: 10/20/2014 10:06 AM Staffing Anesthesiologist: Marcie Bal, ADAM Resident/CRNA: Darlys Gales R Performed by: anesthesiologist and resident/CRNA  Preanesthetic Checklist Completed: patient identified, site marked, surgical consent, pre-op evaluation, timeout performed, IV checked, risks and benefits discussed and monitors and equipment checked Spinal Block Patient position: sitting Prep: Betadine Patient monitoring: heart rate, continuous pulse ox and blood pressure Approach: right paramedian Location: L3-4 Injection technique: single-shot Needle Needle type: Quincke  Needle gauge: 22 G Needle length: 9 cm Needle insertion depth: 7 cm Assessment Sensory level: T6 Additional Notes Attempt x 2 CRNA midline. Attempt x2 MD (midline unsuccessful). CSF return with right paramedian approach. Expiration date of kit checked and confirmed. Patient tolerated procedure well, without complications.

## 2014-10-20 NOTE — Transfer of Care (Signed)
Immediate Anesthesia Transfer of Care Note  Patient: Lindsay Jimenez  Procedure(s) Performed: Procedure(s): RIGHT TOTAL HIP ARTHROPLASTY ANTERIOR APPROACH (Right)  Patient Location: PACU  Anesthesia Type:Spinal  Level of Consciousness:  sedated, patient cooperative and responds to stimulation  Airway & Oxygen Therapy:Patient Spontanous Breathing and Patient connected to face mask oxgen  Post-op Assessment:  Report given to PACU RN and Post -op Vital signs reviewed and stable  Post vital signs:  Reviewed and stable  Last Vitals:  Filed Vitals:   10/20/14 0753  BP: 134/82  Pulse: 81  Temp: 36.7 C  Resp: 16    Complications: No apparent anesthesia complications L2 level on release to PACU staff.

## 2014-10-20 NOTE — Plan of Care (Signed)
Problem: Consults Goal: Diagnosis- Total Joint Replacement Primary Total Hip     

## 2014-10-20 NOTE — Brief Op Note (Signed)
10/20/2014  11:32 AM  PATIENT:  Lindsay Jimenez  64 y.o. female  PRE-OPERATIVE DIAGNOSIS:  Osteoarthritis right hip  POST-OPERATIVE DIAGNOSIS:  Osteoarthritis right hip  PROCEDURE:  Procedure(s): RIGHT TOTAL HIP ARTHROPLASTY ANTERIOR APPROACH (Right)  SURGEON:  Surgeon(s) and Role:    * Mcarthur Rossetti, MD - Primary  PHYSICIAN ASSISTANT: Benita Stabile, PA-C  ANESTHESIA:   spinal  EBL:  Total I/O In: 1000 [I.V.:1000] Out: 450 [Urine:100; Blood:350]  BLOOD ADMINISTERED:none  DRAINS: none   LOCAL MEDICATIONS USED:  NONE  SPECIMEN:  No Specimen  DISPOSITION OF SPECIMEN:  N/A  COUNTS:  YES  TOURNIQUET:  * No tourniquets in log *  DICTATION: .Other Dictation: Dictation Number [id number]  PLAN OF CARE: Admit to inpatient   PATIENT DISPOSITION:  PACU - hemodynamically stable.   Delay start of Pharmacological VTE agent (>24hrs) due to surgical blood loss or risk of bleeding: no

## 2014-10-20 NOTE — Anesthesia Postprocedure Evaluation (Signed)
  Anesthesia Post-op Note  Patient: Lindsay Jimenez  Procedure(s) Performed: Procedure(s): RIGHT TOTAL HIP ARTHROPLASTY ANTERIOR APPROACH (Right)  Patient Location: PACU  Anesthesia Type:Spinal  Level of Consciousness: awake, alert  and oriented  Airway and Oxygen Therapy: Patient Spontanous Breathing  Post-op Pain: none  Post-op Assessment: Post-op Vital signs reviewed, Patient's Cardiovascular Status Stable and Respiratory Function Stable LLE Motor Response: Purposeful movement (knee) LLE Sensation: Decreased RLE Motor Response: Purposeful movement (thigh) RLE Sensation: Decreased L Sensory Level: L5-Outer lower leg, top of foot, great toe R Sensory Level: L5-Outer lower leg, top of foot, great toe  Post-op Vital Signs: Reviewed and stable  Last Vitals:  Filed Vitals:   10/20/14 1300  BP: 108/55  Pulse: 68  Temp: 36.6 C  Resp: 12    Complications: No apparent anesthesia complications

## 2014-10-20 NOTE — H&P (Signed)
TOTAL HIP ADMISSION H&P  Patient is admitted for right total hip arthroplasty.  Subjective:  Chief Complaint: right hip pain  HPI: Lindsay Jimenez, 64 y.o. female, has a history of pain and functional disability in the right hip(s) due to arthritis and patient has failed non-surgical conservative treatments for greater than 12 weeks to include NSAID's and/or analgesics, corticosteriod injections, flexibility and strengthening excercises, supervised PT with diminished ADL's post treatment, use of assistive devices, weight reduction as appropriate and activity modification.  Onset of symptoms was gradual starting 4 years ago with gradually worsening course since that time.The patient noted no past surgery on the right hip(s).  Patient currently rates pain in the right hip at 10 out of 10 with activity. Patient has night pain, worsening of pain with activity and weight bearing, trendelenberg gait, pain that interfers with activities of daily living and pain with passive range of motion. Patient has evidence of subchondral cysts, subchondral sclerosis, periarticular osteophytes and joint space narrowing by imaging studies. This condition presents safety issues increasing the risk of falls.  There is no current active infection.  Patient Active Problem List   Diagnosis Date Noted  . Osteoarthritis of right hip 10/20/2014   Past Medical History  Diagnosis Date  . Breast CA   . GERD (gastroesophageal reflux disease)     occasional  . Arthritis     oa  . Family history of adverse reaction to anesthesia     aunt had problem , not sure what problem  was  . PONV (postoperative nausea and vomiting)     Past Surgical History  Procedure Laterality Date  . Knee surgery Right 1970    cartlidge removed  . Oophorectomy Bilateral 2004  . Mastectomy  2002  . Ablation to both knees  april and may 2016    to deaden nerves    No prescriptions prior to admission   Allergies  Allergen Reactions  .  Codeine Other (See Comments)    Patient states that the medication makes her crazy and that she can't lift her head from the pillow.  . Tape Other (See Comments)    Contact dermatitis.adhesive tape causes problems, likes paper tape    Social History  Substance Use Topics  . Smoking status: Never Smoker   . Smokeless tobacco: Never Used  . Alcohol Use: No    No family history on file.   Review of Systems  Musculoskeletal: Positive for joint pain.  All other systems reviewed and are negative.   Objective:  Physical Exam  Constitutional: She is oriented to person, place, and time. She appears well-developed and well-nourished.  HENT:  Head: Normocephalic and atraumatic.  Eyes: EOM are normal. Pupils are equal, round, and reactive to light.  Neck: Normal range of motion. Neck supple.  Cardiovascular: Regular rhythm.   Respiratory: Effort normal and breath sounds normal.  GI: Soft. Bowel sounds are normal.  Musculoskeletal:       Right hip: She exhibits decreased range of motion, decreased strength, tenderness and bony tenderness.  Neurological: She is alert and oriented to person, place, and time.  Skin: Skin is warm and dry.  Psychiatric: She has a normal mood and affect.    Vital signs in last 24 hours:    Labs:   Estimated body mass index is 28.90 kg/(m^2) as calculated from the following:   Height as of 06/04/11: 5\' 8"  (1.727 m).   Weight as of 06/04/11: 86.183 kg (190 lb).   Imaging  Review Plain radiographs demonstrate severe degenerative joint disease of the right hip(s). The bone quality appears to be good for age and reported activity level.  Assessment/Plan:  End stage arthritis, right hip(s)  The patient history, physical examination, clinical judgement of the provider and imaging studies are consistent with end stage degenerative joint disease of the right hip(s) and total hip arthroplasty is deemed medically necessary. The treatment options including  medical management, injection therapy, arthroscopy and arthroplasty were discussed at length. The risks and benefits of total hip arthroplasty were presented and reviewed. The risks due to aseptic loosening, infection, stiffness, dislocation/subluxation,  thromboembolic complications and other imponderables were discussed.  The patient acknowledged the explanation, agreed to proceed with the plan and consent was signed. Patient is being admitted for inpatient treatment for surgery, pain control, PT, OT, prophylactic antibiotics, VTE prophylaxis, progressive ambulation and ADL's and discharge planning.The patient is planning to be discharged home with home health services

## 2014-10-21 LAB — BASIC METABOLIC PANEL
Anion gap: 4 — ABNORMAL LOW (ref 5–15)
BUN: 11 mg/dL (ref 6–20)
CO2: 29 mmol/L (ref 22–32)
Calcium: 8.6 mg/dL — ABNORMAL LOW (ref 8.9–10.3)
Chloride: 105 mmol/L (ref 101–111)
Creatinine, Ser: 0.57 mg/dL (ref 0.44–1.00)
GFR calc Af Amer: >60 mL/min (ref >60)
GLUCOSE: 123 mg/dL — AB (ref 65–99)
POTASSIUM: 4.2 mmol/L (ref 3.5–5.1)
Sodium: 138 mmol/L (ref 135–145)

## 2014-10-21 LAB — CBC
HCT: 33.7 % — ABNORMAL LOW (ref 36.0–46.0)
Hemoglobin: 11.4 g/dL — ABNORMAL LOW (ref 12.0–15.0)
MCH: 30.4 pg (ref 26.0–34.0)
MCHC: 33.8 g/dL (ref 30.0–36.0)
MCV: 89.9 fL (ref 78.0–100.0)
PLATELETS: 200 10*3/uL (ref 150–400)
RBC: 3.75 MIL/uL — ABNORMAL LOW (ref 3.87–5.11)
RDW: 13.2 % (ref 11.5–15.5)
WBC: 12.2 10*3/uL — ABNORMAL HIGH (ref 4.0–10.5)

## 2014-10-21 MED ORDER — METHOCARBAMOL 500 MG PO TABS: 500.0000 mg | ORAL_TABLET | Freq: Four times a day (QID) | ORAL | Status: DC | PRN

## 2014-10-21 MED ORDER — ASPIRIN 325 MG PO TBEC: 325.0000 mg | DELAYED_RELEASE_TABLET | Freq: Two times a day (BID) | ORAL | Status: DC

## 2014-10-21 MED ORDER — OXYCODONE-ACETAMINOPHEN 5-325 MG PO TABS: 1.0000 | ORAL_TABLET | ORAL | Status: DC | PRN

## 2014-10-21 NOTE — Discharge Instructions (Signed)

## 2014-10-21 NOTE — Op Note (Signed)
[name], Jimenez                ACCOUNT NO.:  [id number]  MEDICAL RECORD NO.:  [id number]  LOCATION:  [place]                         FACILITY:  [facility]  PHYSICIAN:  Lind Guest. Ninfa Linden, M.D.DATE OF BIRTH:  [birth year]  DATE OF PROCEDURE:  10/20/2014 DATE OF DISCHARGE:                              OPERATIVE REPORT   PREOPERATIVE DIAGNOSIS:  Primary osteoarthritis and degenerative joint disease, right hip.  POSTOPERATIVE DIAGNOSIS:  Primary osteoarthritis and degenerative joint disease, right hip.  PROCEDURE:  Right total hip arthroplasty through direct anterior approach.  IMPLANTS:  DePuy Sector Gription acetabular component size 50, size 32 +0 neutral polyethylene liner, size 14 Corail femoral component with standard offset, size 32 +5 ceramic hip ball.  SURGEON:  Lind Guest. Ninfa Linden, M.D.  ASSISTANT:  Erskine Emery, P.A.  ANESTHESIA:  Spinal.  ANTIBIOTICS:  2 g IV Ancef.  BLOOD LOSS:  350 mL.  COMPLICATIONS:  None.  INDICATIONS:  Lindsay Jimenez is a 64 year old patient well known to me.  She has primary osteoarthritis and degenerative joint disease involving her right hip.  She has failed conservative treatment measures including anti-inflammatories, rest, ice, heat, walking with assisted device, activity modification, weight loss, and injections.  With the failure of these, she does wish to proceed with a total hip arthroplasty.  She understands the risk of acute blood loss anemia, nerve and vessel injury, fracture and fracture dislocation, and DVT.  She understands our goals are to decrease pain, improve mobility, and overall improve quality of life.  PROCEDURE DESCRIPTION:  After informed consent was obtained, appropriate right hip was marked.  She was brought to the operating room.  Spinal anesthesia was obtained while she was on her stretcher.  She was then laid in a supine position on the stretcher.  A Foley catheter was placed and both feet had traction  boots applied to them.  Next, she was placed supine on the Hana fracture table with a perineal post in place and both legs in inline skeletal traction devices but no traction applied.  Her right operative hip was then prepped and draped with DuraPrep and sterile drapes.  A time-out was called.  She was identified as the correct patient and correct right hip.  I then made an incision inferior and posterior to the anterior-superior iliac spine and carried this distally down the leg.  I dissected down the tensor fascia lata muscle and tensor fascia was then divided longitudinally so we could proceed with direct anterior approach to the hip.  We identified and cauterized the lateral femoral circumflex vessels and then identified the hip capsule and placed a Cobra retractor around the lateral, medial, and femoral neck.  We then made our femoral neck cut with an oscillating saw proximal to the lesser trochanter and completed this with an osteotome. We placed a corkscrew guide in the femoral head and removed the femoral head from its entirety and found it to be devoid of cartilage.  We then cleaned the acetabular remnants from the acetabular labrum and other debris and placed a bent Hohmann over the medial acetabular rim and released the transverse acetabular ligament.  We then began reaming under direct visualization from a size 42  reamer all the way up to a size 50 with all reamers under direct visualization and last reamer also under direct fluoroscopy so that we could obtain our depth of reaming, our inclination and anteversion.  Once we were pleased with this, we placed a DePuy Sector Gription acetabular component size 50, and a 32 +0 neutral polyethylene liner for that size acetabular component. Attention was then turned to the femur.  With the leg externally rotated to 100 degrees, extended and adducted, we were able to place a Mueller retractor medially and a Hohmann retractor greater  behind the greater trochanter.  We released the lateral joint capsule and used a box cutting osteotome to enter the femoral canal and a rongeur to lateralize.  We then began broaching using Corail broaching system from a size 8 trial broach all the way up to a size 14.  With a 14 trial standard neck and a 32 +1 hip ball, we brought the leg back over and up. With traction and internal rotation, we reduced it in the pelvis.  We were pleased with stability and offset, but I felt like we could get a little more leg length out of her.  We then removed the trial components and placed the real size 14 Corail femoral component with standard offset and a real 32 +5 ceramic hip ball.  We reduced this in the acetabulum.  We were pleased with stability.  We then irrigated the soft tissue with normal saline solution using pulsatile lavage.  We closed the joint capsule with interrupted #1 Ethibond suture, followed by running #1 Vicryl in the tensor fascia, 0 Vicryl in the deep tissue, 2-0 Vicryl in subcutaneous tissue, 4-0 Monocryl subcuticular stitch, and Steri-Strips on the skin.  An Aquacel dressing was applied.  She was then taken off the Hana table, awakened, extubated, and taken to recovery room in stable condition.  All final counts were correct. There were no complications noted.  Of note, Erskine Emery, PA-C assisted in the entire case.  His assistance was crucial in facilitating all aspects of this case.     Lind Guest. Ninfa Linden, M.D.     CYB/MEDQ  D:  10/20/2014  T:  10/21/2014  Job:  [id number]

## 2014-10-21 NOTE — Care Management Note (Signed)
Case Management Note  Patient Details  Name: Lindsay Jimenez MRN: [id number] Date of Birth: [birth year]  Subjective/Objective:     Status post total replacement of right hip              Action/Plan: Home Health  Expected Discharge Date:  10/21/2014             Expected Discharge Plan:  [name]  In-House Referral:     Discharge planning Services  CM Consult  Post Acute Care Choice:  Home Health Choice offered to:      [name] Arranged:  PT HH Agency:  [name]  Status of Service:  Completed, signed off  Medicare Important Message Given:    Date Medicare IM Given:    Medicare IM give by:    Date Additional Medicare IM Given:    Additional Medicare Important Message give by:     If discussed at [place] of Stay Meetings, dates discussed:    Additional Comments: NCM spoke to pt and offered choice for [name]. Pt agreeable to [facility] for [name]. Pt states she has RW at home. Contacted [name] with pt's cell number. Pt prefers calls on cell number.  Erenest Rasher, RN 10/21/2014, 3:03 PM

## 2014-10-21 NOTE — Discharge Summary (Signed)
Reviewed d/c instructions with pt and spouse including follow-up appointment, medications, precautions, and incision care.  Pt/spouse verbalized good understanding of all instructions.  Pt being d/c to home into care of spouse with [facility] follow up from [place].

## 2014-10-21 NOTE — Evaluation (Signed)
Occupational Therapy One Time Evaluation Patient Details Name: Lindsay Jimenez MRN: [id number] DOB: August 03, 1950 Today's Date: 10/21/2014    History of Present Illness Pt is s/p R direct anterior THA   Clinical Impression   Pt doing well. All education completed and close friends can assist at d/c. Pt has her 3in1 and encouraged her to use 3in1 initially as she had slight difficulty transitioning on and off comfort height commode.     Follow Up Recommendations  No OT follow up    Equipment Recommendations  None recommended by OT    Recommendations for Other Services       Precautions / Restrictions Precautions Precautions: None Restrictions Weight Bearing Restrictions: No      Mobility Bed Mobility               General bed mobility comments: in chair.   Transfers Overall transfer level: Needs assistance Equipment used: Rolling walker (2 wheeled) Transfers: Sit to/from Stand Sit to Stand: Min guard         General transfer comment: cues for hand placement. some difficulty standing from commode so encouraged grab bar.     Balance                                            ADL Overall ADL's : Needs assistance/impaired Eating/Feeding: Independent;Sitting   Grooming: Wash/dry hands;Min guard;Standing   Upper Body Bathing: Set up;Sitting   Lower Body Bathing: Minimal assistance;Sit to/from stand   Upper Body Dressing : Set up;Sitting   Lower Body Dressing: Minimal assistance;Sit to/from stand   Toilet Transfer: Min guard;Ambulation;Comfort height toilet;Grab bars;RW   Toileting- Water quality scientist and Hygiene: Min guard;Sit to/from stand   Tub/ Shower Transfer: Minimal assistance;Rolling walker;3 in 1     General ADL Comments: Educated on sequence for LB dressing and standing up to pull up clothing. Demonstrated all AE but pt states her friends can assist but she does have a Secondary school teacher. practiced shower transfer X 2 and she did  well. She has a built in seat. Pt with some difficulty transitioning down onto comfort height commdoe and standing so advised her to use her 3in1 initially at home until easier to get up and down as she has no vanity beside commode.      Vision     Perception     Praxis      Pertinent Vitals/Pain Pain Assessment: 0-10 Pain Location: states no pain just alittle sore. not rated. Pain Intervention(s): Monitored during session     Hand Dominance     Extremity/Trunk Assessment Upper Extremity Assessment Upper Extremity Assessment: Overall WFL for tasks assessed           Communication Communication Communication: No difficulties   Cognition Arousal/Alertness: Awake/alert Behavior During Therapy: WFL for tasks assessed/performed Overall Cognitive Status: Within Functional Limits for tasks assessed                     General Comments       Exercises       Shoulder Instructions      Home Living Family/patient expects to be discharged to:: Private residence Living Arrangements: Alone Available Help at Discharge: Friend(s) Type of Home: House Home Access: Stairs to enter [employer] of Steps: 3 Entrance Stairs-Rails: None Home Layout: One level     Bathroom Shower/Tub: Hospital doctor  Toilet: Handicapped height Bathroom Accessibility: Yes   Home Equipment: [place] - 2 wheels;Bedside commode;Shower seat - built in          Prior Functioning/Environment Level of Independence: Independent             OT Diagnosis: Generalized weakness   OT Problem List:     OT Treatment/Interventions:      OT Goals(Current goals can be found in the care plan section) Acute Rehab OT Goals Patient Stated Goal: home soon OT Goal Formulation: With patient  OT Frequency:     Barriers to D/C:            Co-evaluation              End of Session Equipment Utilized During Treatment: Rolling walker  Activity Tolerance: Patient  tolerated treatment well Patient left: in chair;with call bell/phone within reach   Time: 0913-0937 OT Time Calculation (min): 24 min Charges:  OT General Charges $OT Visit: 1 Procedure OT Evaluation $Initial OT Evaluation Tier I: 1 Procedure OT Treatments $Therapeutic Activity: 8-22 mins G-Codes:    Jules Schick  [phone] 10/21/2014, 9:48 AM

## 2014-10-21 NOTE — Progress Notes (Addendum)
Physical Therapy Treatment Patient Details Name: Lindsay Jimenez MRN: [id number] DOB: August 29, 1950 Today's Date: 29-Oct-2014    History of Present Illness Pt is s/p R direct anterior THA    PT Comments    Pt progressing well and eager for dc.  Reviewed stairs, car transfers and therex.  Handouts provided  Follow Up Recommendations  Home health PT     Equipment Recommendations  None recommended by PT    Recommendations for Other Services OT consult     Precautions / Restrictions Precautions Precautions: Fall Restrictions Weight Bearing Restrictions: No Other Position/Activity Restrictions: WBAT    Mobility  Bed Mobility               General bed mobility comments: NT - pt OOB and declined back to bed  Transfers Overall transfer level: Needs assistance Equipment used: Rolling walker (2 wheeled) Transfers: Sit to/from Stand Sit to Stand: Supervision         General transfer comment: cues for LE management and use of UEs to self assist  Ambulation/Gait Ambulation/Gait assistance: Min guard;Supervision Ambulation Distance (Feet): 200 Feet Assistive device: Rolling walker (2 wheeled) Gait Pattern/deviations: Step-to pattern;Step-through pattern;Decreased step length - right;Decreased step length - left;Shuffle;Trunk flexed Gait velocity: mod pace   General Gait Details: cues for posture, position from RW and iniital sequence   Stairs Stairs: Yes Stairs assistance: Min assist Stair Management: No rails;Backwards;With walker;Step to pattern Number of Stairs: 4 General stair comments: cues for sequence and foot/RW placement.  Written instructions provided  Wheelchair Mobility    Modified Rankin (Stroke Patients Only)       Balance                                    Cognition Arousal/Alertness: Awake/alert Behavior During Therapy: WFL for tasks assessed/performed Overall Cognitive Status: Within Functional Limits for tasks assessed                       Exercises      General Comments        Pertinent Vitals/Pain Pain Assessment: 0-10 Pain Score: 3  Pain Location: R hip Pain Descriptors / Indicators: Aching;Sore Pain Intervention(s): Limited activity within patient's tolerance;Monitored during session;Premedicated before session    Home Living                      Prior Function            PT Goals (current goals can now be found in the care plan section) Acute Rehab PT Goals Patient Stated Goal: home soon PT Goal Formulation: With patient Time For Goal Achievement: 10/24/14 Potential to Achieve Goals: Good Progress towards PT goals: Progressing toward goals    Frequency  7X/week    PT Plan Current plan remains appropriate    Co-evaluation             End of Session Equipment Utilized During Treatment: Gait belt Activity Tolerance: Patient tolerated treatment well Patient left: in chair;with call bell/phone within reach;with family/visitor present     Time: 4098-1191 PT Time Calculation (min) (ACUTE ONLY): 23 min  Charges:  $Gait Training: 8-22 mins $Therapeutic Activity: 8-22 mins                    G Codes:      Kashaun Bebo 10-29-14, 5:10 PM

## 2014-10-21 NOTE — Evaluation (Signed)
Physical Therapy Evaluation Patient Details Name: MARICIA SCOTTI MRN: [id number] DOB: 09/17/1950 Today's Date: 10/21/2014   History of Present Illness  Pt is s/p R direct anterior THA  Clinical Impression  Pt s/p R THR presents with decreased R LE strength/ROM and post op pain limiting functional mobility.  Pt should progress well to dc home with family assist and HHPT follow up.    Follow Up Recommendations Home health PT    Equipment Recommendations  None recommended by PT    Recommendations for Other Services OT consult     Precautions / Restrictions Precautions Precautions: Fall Restrictions Weight Bearing Restrictions: No Other Position/Activity Restrictions: WBAT      Mobility  Bed Mobility Overal bed mobility: Needs Assistance Bed Mobility: Supine to Sit     Supine to sit: Min assist     General bed mobility comments: cues for sequence and use of L LE to self assist.  Transfers Overall transfer level: Needs assistance Equipment used: Rolling walker (2 wheeled) Transfers: Sit to/from Stand Sit to Stand: Min guard         General transfer comment: cues for LE management and use of UEs to self assist  Ambulation/Gait Ambulation/Gait assistance: Min assist;Min guard Ambulation Distance (Feet): 147 Feet Assistive device: Rolling walker (2 wheeled) Gait Pattern/deviations: Step-to pattern;Step-through pattern;Decreased step length - right;Decreased step length - left;Shuffle;Trunk flexed     General Gait Details: cues for posture, position from RW and iniital sequence  Stairs            Wheelchair Mobility    Modified Rankin (Stroke Patients Only)       Balance                                             Pertinent Vitals/Pain Pain Assessment: 0-10 Pain Score: 3  Pain Location: R hip Pain Descriptors / Indicators: Sore Pain Intervention(s): Limited activity within patient's tolerance;Monitored during session;Ice  applied    Home Living Family/patient expects to be discharged to:: Private residence Living Arrangements: Alone Available Help at Discharge: Friend(s) Type of Home: House Home Access: Stairs to enter Entrance Stairs-Rails: None Entrance Stairs-Number of Steps: 3 Home Layout: One level Home Equipment: Environmental consultant - 2 wheels;Bedside commode;Shower seat - built in      Prior Function Level of Independence: Independent               Journalist, newspaper        Extremity/Trunk Assessment   Upper Extremity Assessment: Overall WFL for tasks assessed           Lower Extremity Assessment: RLE deficits/detail RLE Deficits / Details: Strength at hip 2+/5 with AAROM at hip to 95 flex and 20 abd       Communication   Communication: No difficulties  Cognition Arousal/Alertness: Awake/alert Behavior During Therapy: WFL for tasks assessed/performed Overall Cognitive Status: Within Functional Limits for tasks assessed                      General Comments      Exercises Total Joint Exercises Ankle Circles/Pumps: AROM;Both;15 reps;Supine Quad Sets: AROM;Both;10 reps;Supine Heel Slides: AAROM;Right;20 reps;Supine Hip ABduction/ADduction: AAROM;Right;15 reps;Supine      Assessment/Plan    PT Assessment Patient needs continued PT services  PT Diagnosis Difficulty walking   PT Problem List Decreased strength;Decreased range of motion;Decreased activity tolerance;Decreased  mobility;Decreased knowledge of use of DME;Pain  PT Treatment Interventions DME instruction;Gait training;Stair training;Functional mobility training;Therapeutic activities;Therapeutic exercise;Patient/family education   PT Goals (Current goals can be found in the Care Plan section) Acute Rehab PT Goals Patient Stated Goal: home soon PT Goal Formulation: With patient Time For Goal Achievement: 10/24/14 Potential to Achieve Goals: Good    Frequency 7X/week   Barriers to discharge         Co-evaluation               End of Session Equipment Utilized During Treatment: Gait belt Activity Tolerance: Patient tolerated treatment well Patient left: in chair;with call bell/phone within reach;with family/visitor present Nurse Communication: Mobility status         Time: 0820-0850 PT Time Calculation (min) (ACUTE ONLY): 30 min   Charges:   PT Evaluation $Initial PT Evaluation Tier I: 1 Procedure PT Treatments $Therapeutic Exercise: 8-22 mins   PT G Codes:        Terrilyn Tyner 10/26/2014, 12:51 PM

## 2014-10-21 NOTE — Progress Notes (Signed)
Subjective: 1 Day Post-Op Procedure(s) (LRB): RIGHT TOTAL HIP ARTHROPLASTY ANTERIOR APPROACH (Right) Patient reports pain as mild.    Objective: Vital signs in last 24 hours: Temp:  [97.6 F (36.4 C)-98.5 F (36.9 C)] 98.4 F (36.9 C) (09/10 0620) Pulse Rate:  [61-87] 84 (09/10 0620) Resp:  [11-16] 16 (09/10 0620) BP: (102-126)/(55-72) 108/62 mmHg (09/10 0620) SpO2:  [100 %] 100 % (09/10 0620)  Intake/Output from previous day: 09/09 0701 - 09/10 0700 In: 3655 [P.O.:600; I.V.:3000; IV Piggyback:55] Out: 2700 [Urine:2350; Blood:350] Intake/Output this shift:     Recent Labs  10/21/14 0546  HGB 11.4*    Recent Labs  10/21/14 0546  WBC 12.2*  RBC 3.75*  HCT 33.7*  PLT 200    Recent Labs  10/21/14 0546  NA 138  K 4.2  CL 105  CO2 29  BUN 11  CREATININE 0.57  GLUCOSE 123*  CALCIUM 8.6*   No results for input(s): LABPT, INR in the last 72 hours.  Neurologically intact  Assessment/Plan: 1 Day Post-Op Procedure(s) (LRB): RIGHT TOTAL HIP ARTHROPLASTY ANTERIOR APPROACH (Right) Up with therapy  Saline lock IV  Lindsay Jimenez C 10/21/2014, 8:08 AM

## 2014-10-21 NOTE — Discharge Summary (Signed)
Patient ID: Lindsay Jimenez MRN: [id number] DOB/AGE: 1950-04-12 64 y.o.  Admit date: 10/20/2014 Discharge date: 10/21/2014  Admission Diagnoses:  Principal Problem:   Osteoarthritis of right hip Active Problems:   Status post total replacement of right hip   Discharge Diagnoses:  Same  Past Medical History  Diagnosis Date  . Breast CA   . GERD (gastroesophageal reflux disease)     occasional  . Arthritis     oa  . Family history of adverse reaction to anesthesia     aunt had problem , not sure what problem  was  . PONV (postoperative nausea and vomiting)     Surgeries: Procedure(s): RIGHT TOTAL HIP ARTHROPLASTY ANTERIOR APPROACH on 10/20/2014   Consultants:    Discharged Condition: Improved  Hospital Course: Lindsay Jimenez is an 64 y.o. female who was admitted 10/20/2014 for operative treatment ofOsteoarthritis of right hip. Patient has severe unremitting pain that affects sleep, daily activities, and work/hobbies. After pre-op clearance the patient was taken to the operating room on 10/20/2014 and underwent  Procedure(s): RIGHT TOTAL HIP ARTHROPLASTY ANTERIOR APPROACH.    Patient was given perioperative antibiotics: Anti-infectives    Start     Dose/Rate Route Frequency Ordered Stop   10/20/14 1600  ceFAZolin (ANCEF) IVPB 1 g/50 mL premix     1 g 100 mL/hr over 30 Minutes Intravenous Every 6 hours 10/20/14 1332 10/20/14 2148   10/20/14 0754  ceFAZolin (ANCEF) IVPB 2 g/50 mL premix     2 g 100 mL/hr over 30 Minutes Intravenous On call to O.R. 10/20/14 0754 10/20/14 1010       Patient was given sequential compression devices, early ambulation, and chemoprophylaxis to prevent DVT.  Patient benefited maximally from hospital stay and there were no complications.    Recent vital signs: Patient Vitals for the past 24 hrs:  BP Temp Temp src Pulse Resp SpO2  10/21/14 0620 108/62 mmHg 98.4 F (36.9 C) Oral 84 16 100 %  10/21/14 0245 (!) 112/57 mmHg 98.4 F (36.9 C) Oral 87 16  100 %  10/20/14 2100 116/62 mmHg 98.5 F (36.9 C) Oral 81 16 100 %  10/20/14 1634 126/67 mmHg 97.6 F (36.4 C) Oral 73 16 100 %  10/20/14 1545 117/69 mmHg 97.8 F (36.6 C) Oral 78 14 100 %  10/20/14 1430 (!) 105/57 mmHg 97.8 F (36.6 C) Oral 78 14 100 %     Recent laboratory studies:  Recent Labs  10/21/14 0546  WBC 12.2*  HGB 11.4*  HCT 33.7*  PLT 200  NA 138  K 4.2  CL 105  CO2 29  BUN 11  CREATININE 0.57  GLUCOSE 123*  CALCIUM 8.6*     Discharge Medications:     Medication List    STOP taking these medications        ibuprofen 200 MG tablet  Commonly known as:  ADVIL,MOTRIN      TAKE these medications        aspirin 325 MG EC tablet  Take 1 tablet (325 mg total) by mouth 2 (two) times daily after a meal.     CALCIUM + D PO  Take 2 tablets by mouth 2 (two) times daily.     celecoxib 200 MG capsule  Commonly known as:  CELEBREX  Take 200 mg by mouth daily as needed for mild pain or moderate pain.     fluticasone 50 MCG/ACT nasal spray  Commonly known as:  FLONASE  Place 1 spray into  both nostrils daily as needed for allergies or rhinitis.     methocarbamol 500 MG tablet  Commonly known as:  ROBAXIN  Take 1 tablet (500 mg total) by mouth every 6 (six) hours as needed for muscle spasms.     multivitamin with minerals Tabs tablet  Take 1 tablet by mouth every morning.     oxyCODONE-acetaminophen 5-325 MG per tablet  Commonly known as:  ROXICET  Take 1-2 tablets by mouth every 4 (four) hours as needed.     SYSTANE OP  Place 1 drop into both eyes as needed (for eye dryness).     triamcinolone acetonide 40 mg in lidocaine-EPINEPHrine 1 %-1:200000 30 mL  Inject into the articular space once. To both knees kenalog 40 mg shots to both knees done 10-03-14        Diagnostic Studies: Dg C-arm 1-60 Min-no Report  10/20/2014   CLINICAL DATA: SURGERY   C-ARM 1-60 MINUTES  Fluoroscopy was utilized by the requesting physician.  No radiographic   interpretation.    Dg Hip Unilat With Pelvis 1v Right  10/20/2014   CLINICAL DATA:  Right hip replacement  EXAM: DG C-ARM 1-60 MIN-NO REPORT; DG HIP (WITH OR WITHOUT PELVIS) 1V RIGHT  COMPARISON:  None.  FLUOROSCOPY TIME:  Radiation Exposure Index (as provided by the fluoroscopic device): Not available  If the device does not provide the exposure index:  Fluoroscopy Time:  33 seconds  Number of Acquired Images:  2  FINDINGS: A right hip replacement is noted. It appears well seated. No acute bony abnormality is seen.  IMPRESSION: Status post right hip replacement   Electronically Signed   By: Inez Catalina M.D.   On: 10/20/2014 11:38   Dg Hip Port Unilat With Pelvis 1v Right  10/20/2014   CLINICAL DATA:  Status post total replacement of right hip  EXAM: DG HIP (WITH OR WITHOUT PELVIS) 1V PORT RIGHT  COMPARISON:  None.  FINDINGS: Status post total hip replacement on the right with some soft tissue postoperative change noted. There appears to be a Foley catheter.  Femoral and acetabular components appear in anticipated position.  IMPRESSION: Anticipated postoperative appearance   Electronically Signed   By: Skipper Cliche M.D.   On: 10/20/2014 12:42    Disposition: 01-Home or Self Care      Discharge Instructions    Discharge patient    Complete by:  As directed            Follow-up Information    Follow up with Mcarthur Rossetti, MD In 2 weeks.   Specialty:  Orthopedic Surgery   Contact information:   [place] [address] [phone]        Signed: Mcarthur Rossetti 10/21/2014, 1:29 PM   !

## 2014-10-23 ENCOUNTER — Encounter (HOSPITAL_COMMUNITY): Payer: Self-pay | Admitting: Orthopaedic Surgery

## 2015-02-13 ENCOUNTER — Other Ambulatory Visit (HOSPITAL_COMMUNITY): Payer: Self-pay | Admitting: Orthopaedic Surgery

## 2015-02-20 ENCOUNTER — Other Ambulatory Visit (HOSPITAL_COMMUNITY): Payer: Self-pay | Admitting: *Deleted

## 2015-02-20 NOTE — Patient Instructions (Signed)
HEALY FURTAK  02/20/2015   Your procedure is scheduled on: 03-02-15  Report to [facility] Main  Entrance take [facility]  elevators to 3rd floor to  [place] at 800 AM.  Call this number if you have problems the morning of surgery [phone]   Remember: ONLY 1 PERSON MAY GO WITH YOU TO SHORT STAY TO GET  READY MORNING OF [place].  Do not eat food or drink liquids :After Midnight.     Take these medicines the morning of surgery with A SIP OF WATER: flonase nasal spray if needed, eye drop                               You may not have any metal on your body including hair pins and              piercings  Do not wear jewelry, make-up, lotions, powders or perfumes, deodorant             Do not wear nail polish.  Do not shave  48 hours prior to surgery.              Men may shave face and neck.   Do not bring valuables to the hospital. [place].  Contacts, dentures or bridgework may not be worn into surgery.  Leave suitcase in the car. After surgery it may be brought to your room.     Patients discharged the day of surgery will not be allowed to drive home.  Name and phone number of your driver:  Special Instructions: N/A              Please read over the following fact sheets you were given: _____________________________________________________________________             [facility] - Preparing for Surgery Before surgery, you can play an important role.  Because skin is not sterile, your skin needs to be as free of germs as possible.  You can reduce the number of germs on your skin by washing with CHG (chlorahexidine gluconate) soap before surgery.  CHG is an antiseptic cleaner which kills germs and bonds with the skin to continue killing germs even after washing. Please DO NOT use if you have an allergy to CHG or antibacterial soaps.  If your skin becomes reddened/irritated stop using the CHG and  inform your nurse when you arrive at Short Stay. Do not shave (including legs and underarms) for at least 48 hours prior to the first CHG shower.  You may shave your face/neck. Please follow these instructions carefully:  1.  Shower with CHG Soap the night before surgery and the  morning of Surgery.  2.  If you choose to wash your hair, wash your hair first as usual with your  normal  shampoo.  3.  After you shampoo, rinse your hair and body thoroughly to remove the  shampoo.                           4.  Use CHG as you would any other liquid soap.  You can apply chg directly  to the skin and wash  Gently with a scrungie or clean washcloth.  5.  Apply the CHG Soap to your body ONLY FROM THE NECK DOWN.   Do not use on face/ open                           Wound or open sores. Avoid contact with eyes, ears mouth and genitals (private parts).                       Wash face,  Genitals (private parts) with your normal soap.             6.  Wash thoroughly, paying special attention to the area where your surgery  will be performed.  7.  Thoroughly rinse your body with warm water from the neck down.  8.  DO NOT shower/wash with your normal soap after using and rinsing off  the CHG Soap.                9.  Pat yourself dry with a clean towel.            10.  Wear clean pajamas.            11.  Place clean sheets on your bed the night of your first shower and do not  sleep with pets. Day of Surgery : Do not apply any lotions/deodorants the morning of surgery.  Please wear clean clothes to the hospital/surgery center.  FAILURE TO FOLLOW THESE INSTRUCTIONS MAY RESULT IN THE CANCELLATION OF YOUR SURGERY PATIENT SIGNATURE_________________________________  NURSE SIGNATURE__________________________________  ________________________________________________________________________  WHAT IS A BLOOD TRANSFUSION? Blood Transfusion Information  A transfusion is the replacement of blood or  some of its parts. Blood is made up of multiple cells which provide different functions.  Red blood cells carry oxygen and are used for blood loss replacement.  White blood cells fight against infection.  Platelets control bleeding.  Plasma helps clot blood.  Other blood products are available for specialized needs, such as hemophilia or other clotting disorders. BEFORE THE TRANSFUSION  Who gives blood for transfusions?   Healthy volunteers who are fully evaluated to make sure their blood is safe. This is blood bank blood. Transfusion therapy is the safest it has ever been in the practice of medicine. Before blood is taken from a donor, a complete history is taken to make sure that person has no history of diseases nor engages in risky social behavior (examples are intravenous drug use or sexual activity with multiple partners). The donor's travel history is screened to minimize risk of transmitting infections, such as malaria. The donated blood is tested for signs of infectious diseases, such as HIV and hepatitis. The blood is then tested to be sure it is compatible with you in order to minimize the chance of a transfusion reaction. If you or a relative donates blood, this is often done in anticipation of surgery and is not appropriate for emergency situations. It takes many days to process the donated blood. RISKS AND COMPLICATIONS Although transfusion therapy is very safe and saves many lives, the main dangers of transfusion include:   Getting an infectious disease.  Developing a transfusion reaction. This is an allergic reaction to something in the blood you were given. Every precaution is taken to prevent this. The decision to have a blood transfusion has been considered carefully by your caregiver before blood is given. Blood is not given unless the benefits outweigh  the risks. AFTER THE TRANSFUSION  Right after receiving a blood transfusion, you will usually feel much better and more  energetic. This is especially true if your red blood cells have gotten low (anemic). The transfusion raises the level of the red blood cells which carry oxygen, and this usually causes an energy increase.  The nurse administering the transfusion will monitor you carefully for complications. HOME CARE INSTRUCTIONS  No special instructions are needed after a transfusion. You may find your energy is better. Speak with your caregiver about any limitations on activity for underlying diseases you may have. SEEK MEDICAL CARE IF:   Your condition is not improving after your transfusion.  You develop redness or irritation at the intravenous (IV) site. SEEK IMMEDIATE MEDICAL CARE IF:  Any of the following symptoms occur over the next 12 hours:  Shaking chills.  You have a temperature by mouth above 102 F (38.9 C), not controlled by medicine.  Chest, back, or muscle pain.  People around you feel you are not acting correctly or are confused.  Shortness of breath or difficulty breathing.  Dizziness and fainting.  You get a rash or develop hives.  You have a decrease in urine output.  Your urine turns a dark color or changes to pink, red, or brown. Any of the following symptoms occur over the next 10 days:  You have a temperature by mouth above 102 F (38.9 C), not controlled by medicine.  Shortness of breath.  Weakness after normal activity.  The white part of the eye turns yellow (jaundice).  You have a decrease in the amount of urine or are urinating less often.  Your urine turns a dark color or changes to pink, red, or brown. Document Released: 01/25/2000 Document Revised: 04/21/2011 Document Reviewed: 09/13/2007 [facility] Patient Information 2014 [place], [place].  _______________________________________________________________________

## 2015-02-22 ENCOUNTER — Encounter (HOSPITAL_COMMUNITY): Payer: Self-pay

## 2015-02-22 ENCOUNTER — Encounter (HOSPITAL_COMMUNITY)
Admission: RE | Admit: 2015-02-22 | Discharge: 2015-02-22 | Disposition: A | Discharge status: Home / Self Care | Payer: BC Managed Care – PPO | Source: Ambulatory Visit | Attending: Orthopaedic Surgery | Admitting: Orthopaedic Surgery

## 2015-02-22 DIAGNOSIS — Z0183 Encounter for blood typing: Secondary | ICD-10-CM | POA: Diagnosis not present

## 2015-02-22 DIAGNOSIS — M1711 Unilateral primary osteoarthritis, right knee: Secondary | ICD-10-CM | POA: Diagnosis not present

## 2015-02-22 DIAGNOSIS — Z01812 Encounter for preprocedural laboratory examination: Secondary | ICD-10-CM | POA: Diagnosis not present

## 2015-02-22 LAB — CBC
HEMATOCRIT: 42.5 % (ref 36.0–46.0)
HEMOGLOBIN: 13.7 g/dL (ref 12.0–15.0)
MCH: 29.1 pg (ref 26.0–34.0)
MCHC: 32.2 g/dL (ref 30.0–36.0)
MCV: 90.4 fL (ref 78.0–100.0)
Platelets: 274 10*3/uL (ref 150–400)
RBC: 4.7 MIL/uL (ref 3.87–5.11)
RDW: 13.5 % (ref 11.5–15.5)
WBC: 6.8 10*3/uL (ref 4.0–10.5)

## 2015-02-22 LAB — BASIC METABOLIC PANEL
ANION GAP: 10 (ref 5–15)
BUN: 12 mg/dL (ref 6–20)
CALCIUM: 9.9 mg/dL (ref 8.9–10.3)
CO2: 30 mmol/L (ref 22–32)
Chloride: 100 mmol/L — ABNORMAL LOW (ref 101–111)
Creatinine, Ser: 0.7 mg/dL (ref 0.44–1.00)
GFR calc Af Amer: >60 mL/min (ref >60)
GFR calc non Af Amer: >60 mL/min (ref >60)
GLUCOSE: 94 mg/dL (ref 65–99)
Potassium: 5 mmol/L (ref 3.5–5.1)
SODIUM: 140 mmol/L (ref 135–145)

## 2015-02-22 LAB — SURGICAL PCR SCREEN
MRSA, PCR: NEGATIVE
STAPHYLOCOCCUS AUREUS: NEGATIVE

## 2015-03-02 ENCOUNTER — Encounter (HOSPITAL_COMMUNITY): Payer: Self-pay | Admitting: *Deleted

## 2015-03-02 ENCOUNTER — Inpatient Hospital Stay (HOSPITAL_COMMUNITY)
Admission: RE | Admit: 2015-03-02 | Discharge: 2015-03-05 | DRG: 470 | Disposition: A | Discharge status: Home Health | Payer: BC Managed Care – PPO | Source: Ambulatory Visit | Attending: Orthopaedic Surgery | Admitting: Orthopaedic Surgery

## 2015-03-02 ENCOUNTER — Encounter (HOSPITAL_COMMUNITY)
Admission: RE | Disposition: A | Discharge status: Home Health | Payer: Self-pay | Source: Ambulatory Visit | Attending: Orthopaedic Surgery

## 2015-03-02 ENCOUNTER — Inpatient Hospital Stay (HOSPITAL_COMMUNITY): Payer: BC Managed Care – PPO | Admitting: Anesthesiology

## 2015-03-02 ENCOUNTER — Inpatient Hospital Stay (HOSPITAL_COMMUNITY): Payer: BC Managed Care – PPO

## 2015-03-02 DIAGNOSIS — Z96641 Presence of right artificial hip joint: Secondary | ICD-10-CM | POA: Diagnosis present

## 2015-03-02 DIAGNOSIS — Z96651 Presence of right artificial knee joint: Secondary | ICD-10-CM

## 2015-03-02 DIAGNOSIS — M25561 Pain in right knee: Secondary | ICD-10-CM | POA: Diagnosis present

## 2015-03-02 DIAGNOSIS — Z853 Personal history of malignant neoplasm of breast: Secondary | ICD-10-CM | POA: Diagnosis not present

## 2015-03-02 DIAGNOSIS — M1711 Unilateral primary osteoarthritis, right knee: Secondary | ICD-10-CM | POA: Diagnosis present

## 2015-03-02 DIAGNOSIS — K219 Gastro-esophageal reflux disease without esophagitis: Secondary | ICD-10-CM | POA: Diagnosis present

## 2015-03-02 DIAGNOSIS — Z01812 Encounter for preprocedural laboratory examination: Secondary | ICD-10-CM | POA: Diagnosis not present

## 2015-03-02 HISTORY — PX: TOTAL KNEE ARTHROPLASTY: SHX125

## 2015-03-02 LAB — TYPE AND SCREEN
ABO/RH(D): O POS
ANTIBODY SCREEN: NEGATIVE

## 2015-03-02 SURGERY — ARTHROPLASTY, KNEE, TOTAL
Anesthesia: Spinal | Site: Knee | Laterality: Right

## 2015-03-02 MED ORDER — METOCLOPRAMIDE HCL 10 MG PO TABS: 5.0000 mg | ORAL_TABLET | Freq: Three times a day (TID) | ORAL | Status: DC | PRN

## 2015-03-02 MED ORDER — ONDANSETRON HCL 4 MG PO TABS
4.0000 mg | ORAL_TABLET | Freq: Four times a day (QID) | ORAL | Status: DC | PRN
  Administered 2015-03-04: 4 mg via ORAL
  Filled 2015-03-02: qty 1

## 2015-03-02 MED ORDER — MEPERIDINE HCL 50 MG/ML IJ SOLN: 6.2500 mg | INTRAMUSCULAR | Status: DC | PRN

## 2015-03-02 MED ORDER — ZOLPIDEM TARTRATE 5 MG PO TABS: 5.0000 mg | ORAL_TABLET | Freq: Every evening | ORAL | Status: DC | PRN

## 2015-03-02 MED ORDER — BUPIVACAINE IN DEXTROSE 0.75-8.25 % IT SOLN
INTRATHECAL | Status: DC | PRN
  Administered 2015-03-02: 2 mL via INTRATHECAL

## 2015-03-02 MED ORDER — METOCLOPRAMIDE HCL 5 MG/ML IJ SOLN
INTRAMUSCULAR | Status: AC
  Filled 2015-03-02: qty 2

## 2015-03-02 MED ORDER — LACTATED RINGERS IV SOLN: INTRAVENOUS | Status: DC

## 2015-03-02 MED ORDER — RIVAROXABAN 10 MG PO TABS
10.0000 mg | ORAL_TABLET | Freq: Every day | ORAL | Status: DC
  Administered 2015-03-03 – 2015-03-04 (×2): 10 mg via ORAL
  Filled 2015-03-02 (×3): qty 1

## 2015-03-02 MED ORDER — DIPHENHYDRAMINE HCL 12.5 MG/5ML PO ELIX
12.5000 mg | ORAL_SOLUTION | ORAL | Status: DC | PRN
  Administered 2015-03-03 (×3): 12.5 mg via ORAL
  Administered 2015-03-04 (×4): 25 mg via ORAL
  Administered 2015-03-05 (×2): 12.5 mg via ORAL
  Filled 2015-03-02 (×2): qty 5
  Filled 2015-03-02 (×3): qty 10
  Filled 2015-03-02 (×2): qty 5
  Filled 2015-03-02 (×3): qty 10

## 2015-03-02 MED ORDER — LACTATED RINGERS IV SOLN
INTRAVENOUS | Status: DC | PRN
  Administered 2015-03-02 (×2): via INTRAVENOUS

## 2015-03-02 MED ORDER — METOCLOPRAMIDE HCL 5 MG/ML IJ SOLN
INTRAMUSCULAR | Status: DC | PRN
  Administered 2015-03-02: 10 mg via INTRAVENOUS

## 2015-03-02 MED ORDER — PROPOFOL 10 MG/ML IV BOLUS
INTRAVENOUS | Status: AC
  Filled 2015-03-02: qty 60

## 2015-03-02 MED ORDER — GLYCOPYRROLATE 0.2 MG/ML IJ SOLN
INTRAMUSCULAR | Status: DC | PRN
  Administered 2015-03-02: 0.2 mg via INTRAVENOUS

## 2015-03-02 MED ORDER — LIDOCAINE HCL (CARDIAC) 20 MG/ML IV SOLN
INTRAVENOUS | Status: DC | PRN
  Administered 2015-03-02: 50 mg via INTRAVENOUS

## 2015-03-02 MED ORDER — HYDROMORPHONE HCL 1 MG/ML IJ SOLN
0.2500 mg | INTRAMUSCULAR | Status: DC | PRN
  Administered 2015-03-02 (×2): 0.5 mg via INTRAVENOUS

## 2015-03-02 MED ORDER — PHENYLEPHRINE HCL 10 MG/ML IJ SOLN
INTRAMUSCULAR | Status: AC
  Filled 2015-03-02: qty 1

## 2015-03-02 MED ORDER — HYDROMORPHONE HCL 1 MG/ML IJ SOLN
1.0000 mg | INTRAMUSCULAR | Status: DC | PRN
  Administered 2015-03-02 – 2015-03-03 (×7): 1 mg via INTRAVENOUS
  Filled 2015-03-02 (×9): qty 1

## 2015-03-02 MED ORDER — MENTHOL 3 MG MT LOZG: 1.0000 | LOZENGE | OROMUCOSAL | Status: DC | PRN

## 2015-03-02 MED ORDER — METHOCARBAMOL 500 MG PO TABS
500.0000 mg | ORAL_TABLET | Freq: Four times a day (QID) | ORAL | Status: DC | PRN
  Administered 2015-03-02 – 2015-03-05 (×4): 500 mg via ORAL
  Filled 2015-03-02 (×5): qty 1

## 2015-03-02 MED ORDER — MIDAZOLAM HCL 5 MG/5ML IJ SOLN
INTRAMUSCULAR | Status: DC | PRN
Start: 2015-03-02 — End: 2015-03-02
  Administered 2015-03-02 (×3): 1 mg via INTRAVENOUS

## 2015-03-02 MED ORDER — PROPOFOL 10 MG/ML IV BOLUS
INTRAVENOUS | Status: AC
  Filled 2015-03-02: qty 20

## 2015-03-02 MED ORDER — ACETAMINOPHEN 325 MG PO TABS
650.0000 mg | ORAL_TABLET | Freq: Four times a day (QID) | ORAL | Status: DC | PRN
  Administered 2015-03-03 – 2015-03-05 (×4): 650 mg via ORAL
  Filled 2015-03-02 (×4): qty 2

## 2015-03-02 MED ORDER — ONDANSETRON HCL 4 MG/2ML IJ SOLN
INTRAMUSCULAR | Status: AC
  Filled 2015-03-02: qty 2

## 2015-03-02 MED ORDER — MIDAZOLAM HCL 2 MG/2ML IJ SOLN
INTRAMUSCULAR | Status: AC
  Filled 2015-03-02: qty 2

## 2015-03-02 MED ORDER — ACETAMINOPHEN 650 MG RE SUPP: 650.0000 mg | Freq: Four times a day (QID) | RECTAL | Status: DC | PRN

## 2015-03-02 MED ORDER — ALUM & MAG HYDROXIDE-SIMETH 200-200-20 MG/5ML PO SUSP: 30.0000 mL | ORAL | Status: DC | PRN

## 2015-03-02 MED ORDER — ONDANSETRON HCL 4 MG/2ML IJ SOLN
4.0000 mg | Freq: Four times a day (QID) | INTRAMUSCULAR | Status: DC | PRN
  Administered 2015-03-03: 4 mg via INTRAVENOUS
  Filled 2015-03-02: qty 2

## 2015-03-02 MED ORDER — DEXTROSE 5 % IV SOLN
10.0000 mg | INTRAVENOUS | Status: DC | PRN
  Administered 2015-03-02: 50 ug/min via INTRAVENOUS

## 2015-03-02 MED ORDER — PHENYLEPHRINE HCL 10 MG/ML IJ SOLN
INTRAMUSCULAR | Status: DC | PRN
Start: 2015-03-02 — End: 2015-03-02
  Administered 2015-03-02 (×2): 80 ug via INTRAVENOUS
  Administered 2015-03-02 (×2): 40 ug via INTRAVENOUS
  Administered 2015-03-02: 120 ug via INTRAVENOUS
  Administered 2015-03-02: 40 ug via INTRAVENOUS
  Administered 2015-03-02: 80 ug via INTRAVENOUS
  Administered 2015-03-02: 40 ug via INTRAVENOUS
  Administered 2015-03-02: 80 ug via INTRAVENOUS

## 2015-03-02 MED ORDER — FENTANYL CITRATE (PF) 100 MCG/2ML IJ SOLN
INTRAMUSCULAR | Status: DC | PRN
  Administered 2015-03-02 (×4): 25 ug via INTRAVENOUS

## 2015-03-02 MED ORDER — CEFAZOLIN SODIUM 1-5 GM-% IV SOLN
1.0000 g | Freq: Four times a day (QID) | INTRAVENOUS | Status: AC
  Administered 2015-03-02 (×2): 1 g via INTRAVENOUS
  Filled 2015-03-02 (×2): qty 50

## 2015-03-02 MED ORDER — CEFAZOLIN SODIUM-DEXTROSE 2-3 GM-% IV SOLR
2.0000 g | INTRAVENOUS | Status: AC
  Administered 2015-03-02: 2 g via INTRAVENOUS

## 2015-03-02 MED ORDER — HYDROMORPHONE HCL 1 MG/ML IJ SOLN
INTRAMUSCULAR | Status: AC
  Filled 2015-03-02: qty 1

## 2015-03-02 MED ORDER — SODIUM CHLORIDE 0.9 % IR SOLN
Status: DC | PRN
  Administered 2015-03-02: 2000 mL

## 2015-03-02 MED ORDER — PROMETHAZINE HCL 25 MG/ML IJ SOLN: 6.2500 mg | INTRAMUSCULAR | Status: DC | PRN

## 2015-03-02 MED ORDER — GLYCOPYRROLATE 0.2 MG/ML IJ SOLN
INTRAMUSCULAR | Status: AC
  Filled 2015-03-02: qty 1

## 2015-03-02 MED ORDER — LIDOCAINE HCL (CARDIAC) 20 MG/ML IV SOLN
INTRAVENOUS | Status: AC
  Filled 2015-03-02: qty 5

## 2015-03-02 MED ORDER — METOCLOPRAMIDE HCL 5 MG/ML IJ SOLN
5.0000 mg | Freq: Three times a day (TID) | INTRAMUSCULAR | Status: DC | PRN
  Administered 2015-03-02 – 2015-03-03 (×2): 10 mg via INTRAVENOUS
  Filled 2015-03-02 (×2): qty 2

## 2015-03-02 MED ORDER — PROPOFOL 10 MG/ML IV BOLUS
INTRAVENOUS | Status: DC | PRN
  Administered 2015-03-02 (×2): 10 mg via INTRAVENOUS
  Administered 2015-03-02: 30 mg via INTRAVENOUS
  Administered 2015-03-02: 20 mg via INTRAVENOUS
  Administered 2015-03-02: 10 mg via INTRAVENOUS

## 2015-03-02 MED ORDER — DEXAMETHASONE SODIUM PHOSPHATE 10 MG/ML IJ SOLN
INTRAMUSCULAR | Status: DC | PRN
  Administered 2015-03-02: 10 mg via INTRAVENOUS

## 2015-03-02 MED ORDER — FAMOTIDINE 20 MG PO TABS
20.0000 mg | ORAL_TABLET | Freq: Every day | ORAL | Status: DC | PRN
  Administered 2015-03-03: 20 mg via ORAL
  Filled 2015-03-02 (×2): qty 1

## 2015-03-02 MED ORDER — METHOCARBAMOL 1000 MG/10ML IJ SOLN
500.0000 mg | Freq: Four times a day (QID) | INTRAVENOUS | Status: DC | PRN
  Administered 2015-03-02: 500 mg via INTRAVENOUS
  Filled 2015-03-02 (×2): qty 5

## 2015-03-02 MED ORDER — FENTANYL CITRATE (PF) 100 MCG/2ML IJ SOLN
INTRAMUSCULAR | Status: AC
  Filled 2015-03-02: qty 2

## 2015-03-02 MED ORDER — DEXAMETHASONE SODIUM PHOSPHATE 10 MG/ML IJ SOLN
INTRAMUSCULAR | Status: AC
  Filled 2015-03-02: qty 1

## 2015-03-02 MED ORDER — DOCUSATE SODIUM 100 MG PO CAPS
100.0000 mg | ORAL_CAPSULE | Freq: Two times a day (BID) | ORAL | Status: DC
  Administered 2015-03-02 – 2015-03-05 (×5): 100 mg via ORAL

## 2015-03-02 MED ORDER — PHENOL 1.4 % MT LIQD
1.0000 | OROMUCOSAL | Status: DC | PRN
  Filled 2015-03-02: qty 177

## 2015-03-02 MED ORDER — 0.9 % SODIUM CHLORIDE (POUR BTL) OPTIME
TOPICAL | Status: DC | PRN
  Administered 2015-03-02: 1000 mL

## 2015-03-02 MED ORDER — PROPOFOL 500 MG/50ML IV EMUL
INTRAVENOUS | Status: DC | PRN
  Administered 2015-03-02: 20 ug/kg/min via INTRAVENOUS
  Administered 2015-03-02: 50 ug/kg/min via INTRAVENOUS

## 2015-03-02 MED ORDER — SODIUM CHLORIDE 0.9 % IV SOLN
INTRAVENOUS | Status: DC
Start: 2015-03-02 — End: 2015-03-04
  Administered 2015-03-02 – 2015-03-03 (×2): via INTRAVENOUS

## 2015-03-02 MED ORDER — SODIUM CHLORIDE 0.9 % IV SOLN
1000.0000 mg | INTRAVENOUS | Status: AC
  Administered 2015-03-02: 1000 mg via INTRAVENOUS
  Filled 2015-03-02: qty 10

## 2015-03-02 MED ORDER — CEFAZOLIN SODIUM-DEXTROSE 2-3 GM-% IV SOLR
INTRAVENOUS | Status: AC
  Filled 2015-03-02: qty 50

## 2015-03-02 MED ORDER — OXYCODONE HCL 5 MG PO TABS
5.0000 mg | ORAL_TABLET | ORAL | Status: DC | PRN
  Administered 2015-03-02: 5 mg via ORAL
  Administered 2015-03-02 – 2015-03-03 (×8): 10 mg via ORAL
  Filled 2015-03-02 (×2): qty 2
  Filled 2015-03-02: qty 1
  Filled 2015-03-02 (×7): qty 2

## 2015-03-02 MED ORDER — ONDANSETRON HCL 4 MG/2ML IJ SOLN
INTRAMUSCULAR | Status: DC | PRN
  Administered 2015-03-02: 4 mg via INTRAVENOUS

## 2015-03-02 MED ORDER — PHENYLEPHRINE 40 MCG/ML (10ML) SYRINGE FOR IV PUSH (FOR BLOOD PRESSURE SUPPORT)
PREFILLED_SYRINGE | INTRAVENOUS | Status: AC
  Filled 2015-03-02: qty 10

## 2015-03-02 SURGICAL SUPPLY — 57 items
APL SKNCLS STERI-STRIP NONHPOA (GAUZE/BANDAGES/DRESSINGS)
BAG SPEC THK2 15X12 ZIP CLS (MISCELLANEOUS)
BAG ZIPLOCK 12X15 (MISCELLANEOUS) IMPLANT
BANDAGE ACE 6X5 VEL STRL LF (GAUZE/BANDAGES/DRESSINGS) ×2 IMPLANT
BENZOIN TINCTURE PRP APPL 2/3 (GAUZE/BANDAGES/DRESSINGS) IMPLANT
BLADE SAG 18X100X1.27 (BLADE) ×1 IMPLANT
BOWL SMART MIX CTS (DISPOSABLE) ×2 IMPLANT
CAPT KNEE TOTAL 3 ×1 IMPLANT
CATH FOLEY LATEX FREE 16FR (CATHETERS) ×2 IMPLANT
CEMENT BONE 1-PACK (Cement) ×3 IMPLANT
CLOTH BEACON ORANGE TIMEOUT ST (SAFETY) ×2 IMPLANT
CUFF TOURN SGL QUICK 34 (TOURNIQUET CUFF) ×2
CUFF TRNQT CYL 34X4X40X1 (TOURNIQUET CUFF) ×1 IMPLANT
DRAPE U-SHAPE 47X51 STRL (DRAPES) ×2 IMPLANT
DRSG AQUACEL AG ADV 3.5X10 (GAUZE/BANDAGES/DRESSINGS) ×1 IMPLANT
DRSG PAD ABDOMINAL 8X10 ST (GAUZE/BANDAGES/DRESSINGS) ×2 IMPLANT
DURAPREP 26ML APPLICATOR (WOUND CARE) ×2 IMPLANT
ELECT BLADE TIP CTD 4 INCH (ELECTRODE) ×1 IMPLANT
ELECT REM PT RETURN 9FT ADLT (ELECTROSURGICAL) ×2
ELECTRODE REM PT RTRN 9FT ADLT (ELECTROSURGICAL) ×1 IMPLANT
GAUZE SPONGE 4X4 12PLY STRL (GAUZE/BANDAGES/DRESSINGS) ×2 IMPLANT
GAUZE XEROFORM 1X8 LF (GAUZE/BANDAGES/DRESSINGS) IMPLANT
GLOVE BIOGEL PI IND STRL 7.0 (GLOVE) ×1 IMPLANT
GLOVE BIOGEL PI IND STRL 7.5 (GLOVE) IMPLANT
GLOVE BIOGEL PI IND STRL 8 (GLOVE) ×2 IMPLANT
GLOVE BIOGEL PI INDICATOR 7.0 (GLOVE) ×1
GLOVE BIOGEL PI INDICATOR 7.5 (GLOVE) ×1
GLOVE BIOGEL PI INDICATOR 8 (GLOVE) ×2
GLOVE SURG SS PI 7.0 STRL IVOR (GLOVE) ×1 IMPLANT
GLOVE SURG SS PI 7.5 STRL IVOR (GLOVE) ×1 IMPLANT
GLOVE SURG SS PI 8.0 STRL IVOR (GLOVE) ×1 IMPLANT
GOWN SPEC L3 XXLG W/TWL (GOWN DISPOSABLE) ×1 IMPLANT
GOWN STRL REUS W/TWL LRG LVL3 (GOWN DISPOSABLE) ×1 IMPLANT
GOWN STRL REUS W/TWL XL LVL3 (GOWN DISPOSABLE) ×4 IMPLANT
HANDPIECE INTERPULSE COAX TIP (DISPOSABLE) ×2
IMMOBILIZER KNEE 20 (SOFTGOODS) ×2
IMMOBILIZER KNEE 20 THIGH 36 (SOFTGOODS) ×1 IMPLANT
INSERT TIBIA BEARING PS 3 9MM (Insert) ×2 IMPLANT
NS IRRIG 1000ML POUR BTL (IV SOLUTION) ×2 IMPLANT
PACK TOTAL KNEE CUSTOM (KITS) ×2 IMPLANT
PADDING CAST COTTON 6X4 STRL (CAST SUPPLIES) ×3 IMPLANT
POSITIONER SURGICAL ARM (MISCELLANEOUS) ×2 IMPLANT
SET HNDPC FAN SPRY TIP SCT (DISPOSABLE) ×1 IMPLANT
SET PAD KNEE POSITIONER (MISCELLANEOUS) ×2 IMPLANT
STAPLER VISISTAT 35W (STAPLE) IMPLANT
STRIP CLOSURE SKIN 1/2X4 (GAUZE/BANDAGES/DRESSINGS) ×1 IMPLANT
SUCTION FRAZIER 12FR DISP (SUCTIONS) ×2 IMPLANT
SUT MNCRL AB 4-0 PS2 18 (SUTURE) ×2 IMPLANT
SUT VIC AB 0 CT1 27 (SUTURE) ×2
SUT VIC AB 0 CT1 27XBRD ANTBC (SUTURE) ×1 IMPLANT
SUT VIC AB 1 CT1 27 (SUTURE) ×4
SUT VIC AB 1 CT1 27XBRD ANTBC (SUTURE) ×2 IMPLANT
SUT VIC AB 2-0 CT1 27 (SUTURE) ×4
SUT VIC AB 2-0 CT1 TAPERPNT 27 (SUTURE) ×2 IMPLANT
WATER STERILE IRR 1500ML POUR (IV SOLUTION) ×3 IMPLANT
WRAP KNEE MAXI GEL POST OP (GAUZE/BANDAGES/DRESSINGS) ×2 IMPLANT
YANKAUER SUCT BULB TIP 10FT TU (MISCELLANEOUS) ×2 IMPLANT

## 2015-03-02 NOTE — Op Note (Signed)
NAMEYOSHIYE, MOFFO                ACCOUNT NO.:  [id number]  MEDICAL RECORD NO.:  [id number]  LOCATION:  [facility]                         FACILITY:  [facility]  PHYSICIAN:  Lind Guest. Ninfa Linden, M.D.DATE OF BIRTH:  [birth year]  DATE OF PROCEDURE:  03/02/2015 DATE OF DISCHARGE:                              OPERATIVE REPORT   PREOPERATIVE DIAGNOSIS:  Primary osteoarthritis and degenerative joint disease, right knee.  POSTOPERATIVE DIAGNOSIS:  Primary osteoarthritis and degenerative joint disease, right knee.  PROCEDURE:  Right total knee arthroplasty.  IMPLANTS:  Stryker Triathlon knee with size 3 femur, size 3 tibial tray, size 28 patellar button, and a 9 mm fix-bearing polyethylene insert.  SURGEON:  Lind Guest. Ninfa Linden, MD  ASSISTANT:  Erskine Emery, PA-C  ANESTHESIA:  Spinal.  ANTIBIOTICS:  2 g IV Ancef.  BLOOD LOSS:  250 mL.  COMPLICATIONS:  None.  INDICATIONS:  Ms. Lindsay Jimenez is a 65 year old female, well known to me.  She has severe osteoarthritis and degenerative joint disease of right knee. She had this of her right hip as well in September 2016, and underwent a successful right total hip arthroplasty.  Her right knee is still quite bothersome to her.  Her x-rays show complete loss of her joint space especially medially with a very significant varus deformity and patellofemoral arthritic changes.  Her pain is daily.  It has affected her mobility, detrimentally, as well as her quality of life and her activities daily living.  She has tried and failed all forms of conservative treatment and at this point, wants to proceed with the recommended total knee arthroplasty.  She understands the risk of acute blood loss anemia, nerve and vessel injury, fracture, infection, and DVT.  She understands our goals are decreased pain, improved mobility, and overall improved quality of life.  PROCEDURE DESCRIPTION:  After informed consent was obtained, appropriate right knee was  marked.  She was brought to the operating room, placed supine on the operating table.  Spinal anesthesia was obtained.  She was then laid in a supine position.  A nonsterile tourniquet was placed around her upper right thigh and her right leg was prepped and draped with DuraPrep and sterile drapes.  Time-out was called and she was identified as correct patient, correct right knee.  We then used an Esmarch to wrap out the leg and tourniquet was inflated to 300 mm of pressure.  We then made a direct midline incision over the patella and carried this proximally and distally.  We dissected down the knee joint and carried out a medial parapatellar arthrotomy.  We then opened up the knee joint and found a large knee joint effusion and significant periarticular osteophytes throughout the knee and there was a large joint effusion.  There was significant wear in that knee.  With the knee in a flexed position, we removed remnants of ACL, PCL, medial, and lateral meniscus and osteophytes throughout the knee.  We then used the extramedullary cutting guide for the tibia setting for neutral slope and taking 9 mm off the high side and correction of varus and valgus.  We made that cut without difficulty.  We then went to the distal femur and used the  intramedullary guide for the distal femur setting this at 5 degrees right externally rotated and made a 10 mm distal femoral cut. We then brought the knee back down in extension and cleaned debris from the back of the knee and then placed a 9 mm extension block, and we achieved our full extension.  We then went back to the femur, put our femoral sizing guide based off the epicondylar axis in 3 degrees right and chose a size 3 femur.  We put our 4:1 cutting block for 3 femur, made our anterior posterior cuts followed by our chamfer cuts and then we made our femoral box cut.  We went back to the tibia and trialed for a size 3 tibia.  We made our keel cut off  this.  With all trial components in place, we put the knee through range of motion with a 9 mm polyethylene insert.  We were pleased with stability.  We then made our patellar cut and drilled 3 holes for patellar button.  We then removed all trial components and irrigated the knee with normal saline solution. We mixed our cement and cemented the real Stryker Triathlon tibial tray size 3, for right knee followed by real size 3 femur.  We placed a 9 mm fix-bearing polyethylene insert and cemented our patellar button.  Once the cement had hardened and dried, we let the tourniquet down and there was significant bleeding from a lateral geniculate artery.  I had difficulty getting this under control and had to go back up with the tourniquet.  I then removed the polyethylene we had placed and with our tourniquet down, we were able to see the vessel and I was able to then cauterize it which then stopped the bleeding.  Again, the tourniquet was let back down and we irrigated the knee with normal saline solution, washed the knee to make sure the bleeding had subsided and had, so we were able to place a 2nd 9 mm fix-bearing polyethylene insert.  We then closed our arthrotomy with interrupted #1 Vicryl suture followed by 0 Vicryl in the deep tissue, 2-0 Vicryl subcutaneous tissue, and 4-0 Monocryl subcuticular stitch.  Steri-Strips were placed on the skin and well-padded sterile dressing.  Before we left the room, I want to make sure we had soft calf and dopplerable pulses.  Of note, Erskine Emery, PA-C assisted in the entire case.  His assistance was crucial for facilitating all aspects of this case.     Lind Guest. Ninfa Linden, M.D.     CYB/MEDQ  D:  03/02/2015  T:  03/02/2015  Job:  [id number]

## 2015-03-02 NOTE — Transfer of Care (Signed)
Immediate Anesthesia Transfer of Care Note  Patient: Lindsay Jimenez  Procedure(s) Performed: Procedure(s): RIGHT TOTAL KNEE ARTHROPLASTY (Right)  Patient Location: PACU  Anesthesia Type:Spinal  Level of Consciousness: awake, alert , oriented and patient cooperative  Airway & Oxygen Therapy: Patient Spontanous Breathing and Patient connected to face mask oxygen  Post-op Assessment: Report given to RN, Post -op Vital signs reviewed and stable and SAB level L3.  Post vital signs: Reviewed and stable  Last Vitals:  Filed Vitals:   03/02/15 0804  BP: 118/71  Pulse: 87  Temp: 36.7 C  Resp: 16    Complications: No apparent anesthesia complications

## 2015-03-02 NOTE — Anesthesia Procedure Notes (Signed)
Spinal Patient location during procedure: OR Start time: 03/02/2015 9:57 AM End time: 03/02/2015 9:58 AM Staffing Anesthesiologist: HOLLIS, KEVIN D Performed by: anesthesiologist  Preanesthetic Checklist Completed: patient identified, site marked, surgical consent, pre-op evaluation, timeout performed, IV checked, risks and benefits discussed and monitors and equipment checked Spinal Block Patient position: sitting Prep: Betadine Patient monitoring: heart rate, continuous pulse ox, blood pressure and cardiac monitor Approach: midline Location: L4-5 Injection technique: single-shot Needle Needle type: Whitacre and Introducer  Needle gauge: 24 G Needle length: 9 cm Additional Notes Negative paresthesia. Negative blood return. Positive free-flowing CSF. Expiration date of kit checked and confirmed. Patient tolerated procedure well, without complications.     

## 2015-03-02 NOTE — Anesthesia Preprocedure Evaluation (Addendum)
Anesthesia Evaluation  Patient identified by MRN, date of birth, ID band Patient awake    Reviewed: Allergy & Precautions, NPO status , Patient's Chart, lab work & pertinent test results  History of Anesthesia Complications (+) PONV, Family history of anesthesia reaction and history of anesthetic complications  Airway Mallampati: I  TM Distance: >3 FB Neck ROM: Full    Dental  (+) Teeth Intact   Pulmonary neg pulmonary ROS,    breath sounds clear to auscultation- rhonchi       Cardiovascular negative cardio ROS   Rhythm:Regular Rate:Normal     Neuro/Psych negative neurological ROS  negative psych ROS   GI/Hepatic Neg liver ROS, GERD  Medicated,  Endo/Other  negative endocrine ROS  Renal/GU negative Renal ROS  negative genitourinary   Musculoskeletal  (+) Arthritis , Osteoarthritis,    Abdominal   Peds negative pediatric ROS (+)  Hematology negative hematology ROS (+)   Anesthesia Other Findings   Reproductive/Obstetrics negative OB ROS                            Lab Results  Component Value Date   WBC 6.8 02/22/2015   HGB 13.7 02/22/2015   HCT 42.5 02/22/2015   MCV 90.4 02/22/2015   PLT 274 02/22/2015   Lab Results  Component Value Date   INR 0.98 10/11/2014     Anesthesia Physical Anesthesia Plan  ASA: II  Anesthesia Plan: Spinal   Post-op Pain Management:    Induction:   Airway Management Planned:   Additional Equipment:   Intra-op Plan:   Post-operative Plan:   Informed Consent: I have reviewed the patients History and Physical, chart, labs and discussed the procedure including the risks, benefits and alternatives for the proposed anesthesia with the patient or authorized representative who has indicated his/her understanding and acceptance.   Dental advisory given  Plan Discussed with: CRNA  Anesthesia Plan Comments:         Anesthesia Quick  Evaluation

## 2015-03-02 NOTE — Discharge Instructions (Addendum)
Information on my medicine - XARELTO® (Rivaroxaban) ° °This medication education was reviewed with me or my healthcare representative as part of my discharge preparation.  The pharmacist that spoke with me during my hospital stay was:  Absher, Randall K, RPH ° °Why was Xarelto® prescribed for you? °Xarelto® was prescribed for you to reduce the risk of blood clots forming after orthopedic surgery. The medical term for these abnormal blood clots is venous thromboembolism (VTE). ° °What do you need to know about xarelto® ? °Take your Xarelto® ONCE DAILY at the same time every day. °You may take it either with or without food. ° °If you have difficulty swallowing the tablet whole, you may crush it and mix in applesauce just prior to taking your dose. ° °Take Xarelto® exactly as prescribed by your doctor and DO NOT stop taking Xarelto® without talking to the doctor who prescribed the medication.  Stopping without other VTE prevention medication to take the place of Xarelto® may increase your risk of developing a clot. ° °After discharge, you should have regular check-up appointments with your healthcare provider that is prescribing your Xarelto®.   ° °What do you do if you miss a dose? °If you miss a dose, take it as soon as you remember on the same day then continue your regularly scheduled once daily regimen the next day. Do not take two doses of Xarelto® on the same day.  ° °Important Safety Information °A possible side effect of Xarelto® is bleeding. You should call your healthcare provider right away if you experience any of the following: °? Bleeding from an injury or your nose that does not stop. °? Unusual colored urine (red or dark brown) or unusual colored stools (red or black). °? Unusual bruising for unknown reasons. °? A serious fall or if you hit your head (even if there is no bleeding). ° °Some medicines may interact with Xarelto® and might increase your risk of bleeding while on Xarelto®. To help avoid  this, consult your healthcare provider or pharmacist prior to using any new prescription or non-prescription medications, including herbals, vitamins, non-steroidal anti-inflammatory drugs (NSAIDs) and supplements. ° °This website has more information on Xarelto®: www.xarelto.com. ° °INSTRUCTIONS AFTER JOINT REPLACEMENT  ° °o Remove items at home which could result in a fall. This includes throw rugs or furniture in walking pathways °o ICE to the affected joint every three hours while awake for 30 minutes at a time, for at least the first 3-5 days, and then as needed for pain and swelling.  Continue to use ice for pain and swelling. You may notice swelling that will progress down to the foot and ankle.  This is normal after surgery.  Elevate your leg when you are not up walking on it.   °o Continue to use the breathing machine you got in the hospital (incentive spirometer) which will help keep your temperature down.  It is common for your temperature to cycle up and down following surgery, especially at night when you are not up moving around and exerting yourself.  The breathing machine keeps your lungs expanded and your temperature down. ° ° °DIET:  As you were doing prior to hospitalization, we recommend a well-balanced diet. ° °DRESSING / WOUND CARE / SHOWERING ° °Keep the surgical dressing until follow up.  The dressing is water proof, so you can shower without any extra covering.  IF THE DRESSING FALLS OFF or the wound gets wet inside, change the dressing with sterile gauze.    Please use good hand washing techniques before changing the dressing.  Do not use any lotions or creams on the incision until instructed by your surgeon.   ° °ACTIVITY ° °o Increase activity slowly as tolerated, but follow the weight bearing instructions below.   °o No driving for 6 weeks or until further direction given by your physician.  You cannot drive while taking narcotics.  °o No lifting or carrying greater than 10 lbs. until  further directed by your surgeon. °o Avoid periods of inactivity such as sitting longer than an hour when not asleep. This helps prevent blood clots.  °o You may return to work once you are authorized by your doctor.  ° ° ° °WEIGHT BEARING  ° °Weight bearing as tolerated with assist device (walker, cane, etc) as directed, use it as long as suggested by your surgeon or therapist, typically at least 4-6 weeks. ° ° °EXERCISES ° °Results after joint replacement surgery are often greatly improved when you follow the exercise, range of motion and muscle strengthening exercises prescribed by your doctor. Safety measures are also important to protect the joint from further injury. Any time any of these exercises cause you to have increased pain or swelling, decrease what you are doing until you are comfortable again and then slowly increase them. If you have problems or questions, call your caregiver or physical therapist for advice.  ° °Rehabilitation is important following a joint replacement. After just a few days of immobilization, the muscles of the leg can become weakened and shrink (atrophy).  These exercises are designed to build up the tone and strength of the thigh and leg muscles and to improve motion. Often times heat used for twenty to thirty minutes before working out will loosen up your tissues and help with improving the range of motion but do not use heat for the first two weeks following surgery (sometimes heat can increase post-operative swelling).  ° °These exercises can be done on a training (exercise) mat, on the floor, on a table or on a bed. Use whatever works the best and is most comfortable for you.    Use music or television while you are exercising so that the exercises are a pleasant break in your day. This will make your life better with the exercises acting as a break in your routine that you can look forward to.   Perform all exercises about fifteen times, three times per day or as directed.   You should exercise both the operative leg and the other leg as well. ° °Exercises include: °  °• Quad Sets - Tighten up the muscle on the front of the thigh (Quad) and hold for 5-10 seconds.   °• Straight Leg Raises - With your knee straight (if you were given a brace, keep it on), lift the leg to 60 degrees, hold for 3 seconds, and slowly lower the leg.  Perform this exercise against resistance later as your leg gets stronger.  °• Leg Slides: Lying on your back, slowly slide your foot toward your buttocks, bending your knee up off the floor (only go as far as is comfortable). Then slowly slide your foot back down until your leg is flat on the floor again.  °• Angel Wings: Lying on your back spread your legs to the side as far apart as you can without causing discomfort.  °• Hamstring Strength:  Lying on your back, push your heel against the floor with your leg straight by tightening up the   muscles of your buttocks.  Repeat, but this time bend your knee to a comfortable angle, and push your heel against the floor.  You may put a pillow under the heel to make it more comfortable if necessary.  ° °A rehabilitation program following joint replacement surgery can speed recovery and prevent re-injury in the future due to weakened muscles. Contact your doctor or a physical therapist for more information on knee rehabilitation.  ° ° °CONSTIPATION ° °Constipation is defined medically as fewer than three stools per week and severe constipation as less than one stool per week.  Even if you have a regular bowel pattern at home, your normal regimen is likely to be disrupted due to multiple reasons following surgery.  Combination of anesthesia, postoperative narcotics, change in appetite and fluid intake all can affect your bowels.  ° °YOU MUST use at least one of the following options; they are listed in order of increasing strength to get the job done.  They are all available over the counter, and you may need to use some,  POSSIBLY even all of these options:   ° °Drink plenty of fluids (prune juice may be helpful) and high fiber foods °Colace 100 mg by mouth twice a day  °Senokot for constipation as directed and as needed Dulcolax (bisacodyl), take with full glass of water  °Miralax (polyethylene glycol) once or twice a day as needed. ° °If you have tried all these things and are unable to have a bowel movement in the first 3-4 days after surgery call either your surgeon or your primary doctor.   ° °If you experience loose stools or diarrhea, hold the medications until you stool forms back up.  If your symptoms do not get better within 1 week or if they get worse, check with your doctor.  If you experience "the worst abdominal pain ever" or develop nausea or vomiting, please contact the office immediately for further recommendations for treatment. ° ° °ITCHING:  If you experience itching with your medications, try taking only a single pain pill, or even half a pain pill at a time.  You can also use Benadryl over the counter for itching or also to help with sleep.  ° °TED HOSE STOCKINGS:  Use stockings on both legs until for at least 2 weeks or as directed by physician office. They may be removed at night for sleeping. ° °MEDICATIONS:  See your medication summary on the “After Visit Summary” that nursing will review with you.  You may have some home medications which will be placed on hold until you complete the course of blood thinner medication.  It is important for you to complete the blood thinner medication as prescribed. ° °PRECAUTIONS:  If you experience chest pain or shortness of breath - call 911 immediately for transfer to the hospital emergency department.  ° °If you develop a fever greater that 101 F, purulent drainage from wound, increased redness or drainage from wound, foul odor from the wound/dressing, or calf pain - CONTACT YOUR SURGEON.   °                                                °FOLLOW-UP APPOINTMENTS:  If  you do not already have a post-op appointment, please call the office for an appointment to be seen by your surgeon.  Guidelines for how   soon to be seen are listed in your “After Visit Summary”, but are typically between 1-4 weeks after surgery. ° °OTHER INSTRUCTIONS:  ° °Knee Replacement:  Do not place pillow under knee, focus on keeping the knee straight while resting. CPM instructions: 0-90 degrees, 2 hours in the morning, 2 hours in the afternoon, and 2 hours in the evening. Place foam block, curve side up under heel at all times except when in CPM or when walking.  DO NOT modify, tear, cut, or change the foam block in any way. ° °MAKE SURE YOU:  °• Understand these instructions.  °• Get help right away if you are not doing well or get worse.  ° ° °Thank you for letting us be a part of your medical care team.  It is a privilege we respect greatly.  We hope these instructions will help you stay on track for a fast and full recovery!  ° °

## 2015-03-02 NOTE — Progress Notes (Signed)
X-ray results noted 

## 2015-03-02 NOTE — Brief Op Note (Signed)
03/02/2015  11:43 AM  PATIENT:  Lindsay Jimenez  65 y.o. female  PRE-OPERATIVE DIAGNOSIS:  Severe osteoarthritis right knee  POST-OPERATIVE DIAGNOSIS:  Severe osteoarthritis right knee  PROCEDURE:  Procedure(s): RIGHT TOTAL KNEE ARTHROPLASTY (Right)  SURGEON:  Surgeon(s) and Role:    * Mcarthur Rossetti, MD - Primary  PHYSICIAN ASSISTANT: Benita Stabile, PA-C  ANESTHESIA:   spinal  EBL:  Total I/O In: 1000 [I.V.:1000] Out: 400 [Urine:150; Blood:250]  COUNTS:  YES  TOURNIQUET:   Total Tourniquet Time Documented: Thigh (Right) - 56 minutes Thigh (Right) - 5 minutes Total: Thigh (Right) - 61 minutes  Thigh (Right) - 2 minutes Total: Thigh (Right) - 2 minutes   DICTATION: .Other Dictation: Dictation Number [id number]  PLAN OF CARE: Admit to inpatient   PATIENT DISPOSITION:  PACU - hemodynamically stable.   Delay start of Pharmacological VTE agent (>24hrs) due to surgical blood loss or risk of bleeding: no

## 2015-03-02 NOTE — H&P (Signed)
TOTAL KNEE ADMISSION H&P  Patient is being admitted for right total knee arthroplasty.  Subjective:  Chief Complaint:right knee pain.  HPI: Lindsay Jimenez, 65 y.o. female, has a history of pain and functional disability in the right knee due to arthritis and has failed non-surgical conservative treatments for greater than 12 weeks to includeNSAID's and/or analgesics, corticosteriod injections, flexibility and strengthening excercises, supervised PT with diminished ADL's post treatment, use of assistive devices, weight reduction as appropriate and activity modification.  Onset of symptoms was gradual, starting 5 years ago with gradually worsening course since that time. The patient noted no past surgery on the right knee(s).  Patient currently rates pain in the right knee(s) at 10 out of 10 with activity. Patient has night pain, worsening of pain with activity and weight bearing, pain that interferes with activities of daily living, pain with passive range of motion, crepitus and joint swelling.  Patient has evidence of subchondral cysts, subchondral sclerosis, periarticular osteophytes and joint space narrowing by imaging studies. There is no active infection.  Patient Active Problem List   Diagnosis Date Noted  . Osteoarthritis of right knee 03/02/2015  . Osteoarthritis of right hip 10/20/2014  . 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 GERD (gastroesophageal reflux disease)     occasional  . Arthritis     oa  . PONV (postoperative nausea and vomiting)   . Breast CA ([place]) 2002    bilateral  . Family history of adverse reaction to anesthesia     aunt had problem , not sure what problem  was    Past Surgical History  Procedure Laterality Date  . Knee surgery Right 1970    cartlidge removed  . Oophorectomy Bilateral 2004  . Ablation to both knees  april and may 2016    to deaden nerves  . Total hip arthroplasty Right 10/20/2014   Procedure: RIGHT TOTAL HIP ARTHROPLASTY ANTERIOR APPROACH;  Surgeon: Mcarthur Rossetti, MD;  Location: [facility];  Service: Orthopedics;  Laterality: Right;  . Mastectomy Bilateral 2002    No prescriptions prior to admission   Allergies  Allergen Reactions  . Codeine Other (See Comments)    Loopy, "out of it", weak  . Cyclosporine Hives and Itching    Restasis eyedrops Rash, itching on hands  . Latex Dermatitis  . Tape Dermatitis    Tolerates paper tape, adhesive tape causes dermititis    Social History  Substance Use Topics  . Smoking status: Never Smoker   . Smokeless tobacco: Never Used  . Alcohol Use: No    No family history on file.   Review of Systems  Musculoskeletal: Positive for joint pain.  All other systems reviewed and are negative.   Objective:  Physical Exam  Constitutional: She is oriented to person, place, and time. She appears well-developed and well-nourished.  HENT:  Head: Normocephalic and atraumatic.  Eyes: EOM are normal. Pupils are equal, round, and reactive to light.  Neck: Normal range of motion. Neck supple.  Cardiovascular: Normal rate and regular rhythm.   Respiratory: Effort normal and breath sounds normal.  GI: Soft. Bowel sounds are normal.  Musculoskeletal:       Right knee: She exhibits decreased range of motion, swelling, effusion and abnormal alignment. Tenderness found. Medial joint line and lateral joint line tenderness noted.  Neurological: She is alert and oriented to person, place, and time.  Skin: Skin is warm and dry.  Psychiatric: She has a normal mood and affect.    Vital signs in last 24 hours:    Labs:   Estimated body mass index is 27.92 kg/(m^2) as calculated from the following:   Height as of 10/20/14: 5\' 8"  (1.727 m).   Weight as of 10/11/14: 83.28 kg (183 lb 9.6 oz).   Imaging Review Plain radiographs demonstrate severe degenerative joint disease of the right knee(s). The overall alignment ismild varus. The  bone quality appears to be good for age and reported activity level.  Assessment/Plan:  End stage arthritis, right knee   The patient history, physical examination, clinical judgment of the provider and imaging studies are consistent with end stage degenerative joint disease of the right knee(s) and total knee arthroplasty is deemed medically necessary. The treatment options including medical management, injection therapy arthroscopy and arthroplasty were discussed at length. The risks and benefits of total knee arthroplasty were presented and reviewed. The risks due to aseptic loosening, infection, stiffness, patella tracking problems, thromboembolic complications and other imponderables were discussed. The patient acknowledged the explanation, agreed to proceed with the plan and consent was signed. Patient is being admitted for inpatient treatment for surgery, pain control, PT, OT, prophylactic antibiotics, VTE prophylaxis, progressive ambulation and ADL's and discharge planning. The patient is planning to be discharged home with home health services

## 2015-03-02 NOTE — Progress Notes (Signed)
Portable AP and Lateral Right Knee X-RAYS done. 

## 2015-03-02 NOTE — Anesthesia Postprocedure Evaluation (Signed)
Anesthesia Post Note  Patient: Lindsay Jimenez  Procedure(s) Performed: Procedure(s) (LRB): RIGHT TOTAL KNEE ARTHROPLASTY (Right)  Patient location during evaluation: PACU Anesthesia Type: Spinal Level of consciousness: oriented and awake and alert Pain management: pain level controlled Vital Signs Assessment: post-procedure vital signs reviewed and stable Respiratory status: spontaneous breathing, respiratory function stable and patient connected to nasal cannula oxygen Cardiovascular status: blood pressure returned to baseline and stable Postop Assessment: no headache, no backache and spinal receding Anesthetic complications: no    Last Vitals:  Filed Vitals:   03/02/15 1330 03/02/15 1352  BP: 105/62 105/57  Pulse: 80 77  Temp: 36.8 C 36.7 C  Resp: 13 12    Last Pain:  Filed Vitals:   03/02/15 1354  PainSc: [place] Yoshida

## 2015-03-03 LAB — BASIC METABOLIC PANEL
ANION GAP: 8 (ref 5–15)
BUN: 14 mg/dL (ref 6–20)
CALCIUM: 9.1 mg/dL (ref 8.9–10.3)
CO2: 29 mmol/L (ref 22–32)
Chloride: 98 mmol/L — ABNORMAL LOW (ref 101–111)
Creatinine, Ser: 0.62 mg/dL (ref 0.44–1.00)
GLUCOSE: 155 mg/dL — AB (ref 65–99)
Potassium: 5.2 mmol/L — ABNORMAL HIGH (ref 3.5–5.1)
SODIUM: 135 mmol/L (ref 135–145)

## 2015-03-03 LAB — CBC
HCT: 33.3 % — ABNORMAL LOW (ref 36.0–46.0)
Hemoglobin: 10.8 g/dL — ABNORMAL LOW (ref 12.0–15.0)
MCH: 28.5 pg (ref 26.0–34.0)
MCHC: 32.4 g/dL (ref 30.0–36.0)
MCV: 87.9 fL (ref 78.0–100.0)
PLATELETS: 234 10*3/uL (ref 150–400)
RBC: 3.79 MIL/uL — AB (ref 3.87–5.11)
RDW: 13.5 % (ref 11.5–15.5)
WBC: 14.2 10*3/uL — AB (ref 4.0–10.5)

## 2015-03-03 MED ORDER — PROMETHAZINE HCL 12.5 MG PO TABS: 12.5000 mg | ORAL_TABLET | Freq: Four times a day (QID) | ORAL | Status: DC | PRN

## 2015-03-03 MED ORDER — RIVAROXABAN 10 MG PO TABS: 10.0000 mg | ORAL_TABLET | Freq: Every day | ORAL | Status: DC

## 2015-03-03 MED ORDER — OXYCODONE-ACETAMINOPHEN 5-325 MG PO TABS: 1.0000 | ORAL_TABLET | ORAL | Status: DC | PRN

## 2015-03-03 MED ORDER — ONDANSETRON HCL 4 MG PO TABS: 4.0000 mg | ORAL_TABLET | Freq: Four times a day (QID) | ORAL | Status: DC | PRN

## 2015-03-03 MED ORDER — METHOCARBAMOL 500 MG PO TABS: 500.0000 mg | ORAL_TABLET | Freq: Four times a day (QID) | ORAL | Status: AC | PRN

## 2015-03-03 MED ORDER — DOCUSATE SODIUM 100 MG PO CAPS: 100.0000 mg | ORAL_CAPSULE | Freq: Two times a day (BID) | ORAL | Status: DC

## 2015-03-03 NOTE — Progress Notes (Signed)
Physical Therapy Treatment Patient Details Name: Lindsay Jimenez MRN: [id number] DOB: March 03, 1950 Today's Date: 03/03/2015    History of Present Illness R TKR; Pt s/p R THR 9/16    PT Comments    Pt motivated but progressing slowly with mobility 2* nausea with activity.  Follow Up Recommendations  Home health PT     Equipment Recommendations  None recommended by PT    Recommendations for Other Services OT consult     Precautions / Restrictions Precautions Precautions: Fall Required Braces or Orthoses: Knee Immobilizer - Right Knee Immobilizer - Right: Discontinue once straight leg raise with < 10 degree lag Restrictions Weight Bearing Restrictions: No Other Position/Activity Restrictions: WBAT    Mobility  Bed Mobility Overal bed mobility: Needs Assistance Bed Mobility: Sit to Supine       Sit to supine: Min assist   General bed mobility comments: cues for sequence and use of L LE to self assist  Transfers Overall transfer level: Needs assistance Equipment used: Rolling walker (2 wheeled) Transfers: Sit to/from Stand Sit to Stand: Min assist;Mod assist         General transfer comment: cues for LE management and use of UEs to self assist  Ambulation/Gait Ambulation/Gait assistance: Min assist Ambulation Distance (Feet): 40 Feet Assistive device: Rolling walker (2 wheeled) Gait Pattern/deviations: Step-to pattern;Decreased step length - right;Decreased step length - left;Shuffle;Trunk flexed Gait velocity: decr   General Gait Details: cues for sequence, posture and position from [employer]            Wheelchair Mobility    Modified Rankin (Stroke Patients Only)       Balance                                    Cognition Arousal/Alertness: Awake/alert Behavior During Therapy: WFL for tasks assessed/performed Overall Cognitive Status: Within Functional Limits for tasks assessed                      Exercises       General Comments        Pertinent Vitals/Pain Pain Assessment: 0-10 Pain Score: 6  Pain Location: R knee Pain Descriptors / Indicators: Aching;Sore Pain Intervention(s): Limited activity within patient's tolerance;Monitored during session;Patient requesting pain meds-RN notified    Home Living                      Prior Function            PT Goals (current goals can now be found in the care plan section) Acute Rehab PT Goals Patient Stated Goal: Resume previous lifestyle with decreased pain PT Goal Formulation: With patient Time For Goal Achievement: 03/07/15 Potential to Achieve Goals: Good Progress towards PT goals: Progressing toward goals    Frequency  7X/week    PT Plan Current plan remains appropriate    Co-evaluation             End of Session Equipment Utilized During Treatment: Gait belt;Right knee immobilizer Activity Tolerance: Other (comment) (nausea) Patient left: in bed;with call bell/phone within reach;with family/visitor present;with nursing/sitter in room     Time: 1500-1525 PT Time Calculation (min) (ACUTE ONLY): 25 min  Charges:  $Gait Training: 23-37 mins                    G Codes:  Soniya Ashraf 03/03/2015, 4:42 PM

## 2015-03-03 NOTE — Evaluation (Signed)
Physical Therapy Evaluation Patient Details Name: Lindsay Jimenez MRN: [id number] DOB: Nov 20, 1950 Today's Date: 03/03/2015   History of Present Illness  R TKR; Pt s/p R THR 9/16  Clinical Impression  Pt s/p R TKR presents with decreased R LE strength/ROM, post op pain and nausea/dizziness with mobility limiting functional mobility.  Pt should progress to dc home with assist of friends and HHPT follow up.   Follow Up Recommendations Home health PT    Equipment Recommendations  None recommended by PT    Recommendations for Other Services OT consult     Precautions / Restrictions Precautions Precautions: Fall Required Braces or Orthoses: Knee Immobilizer - Right Knee Immobilizer - Right: Discontinue once straight leg raise with < 10 degree lag Restrictions Weight Bearing Restrictions: No Other Position/Activity Restrictions: WBAT      Mobility  Bed Mobility Overal bed mobility: Needs Assistance Bed Mobility: Supine to Sit     Supine to sit: Min assist;Mod assist     General bed mobility comments: cues for sequence and use of L LE to self assist  Transfers Overall transfer level: Needs assistance Equipment used: Rolling walker (2 wheeled) Transfers: Sit to/from Stand Sit to Stand: Min assist;Mod assist;+2 physical assistance         General transfer comment: cues for LE management and use of UEs to self assist  Ambulation/Gait Ambulation/Gait assistance: Min assist;+2 safety/equipment;Mod assist Ambulation Distance (Feet): 10 Feet Assistive device: Rolling walker (2 wheeled) Gait Pattern/deviations: Step-to pattern;Decreased step length - right;Decreased step length - left;Shuffle;Trunk flexed Gait velocity: decr   General Gait Details: cues for sequence, posture and position from [employer]            Wheelchair Mobility    Modified Rankin (Stroke Patients Only)       Balance                                              Pertinent Vitals/Pain Pain Assessment: 0-10 Pain Score: 4  Pain Location: R knee Pain Descriptors / Indicators: Aching;Sore Pain Intervention(s): Limited activity within patient's tolerance;Monitored during session;Premedicated before session;Ice applied    Home Living Family/patient expects to be discharged to:: Private residence Living Arrangements: Alone Available Help at Discharge: Friend(s) Type of Home: House Home Access: Ramped entrance     Home Layout: Able to live on main level with bedroom/bathroom Home Equipment: Walker - 2 wheels;Bedside commode;Shower seat - built in      Prior Function Level of Independence: Independent               Journalist, newspaper        Extremity/Trunk Assessment   Upper Extremity Assessment: Overall WFL for tasks assessed           Lower Extremity Assessment: RLE deficits/detail RLE Deficits / Details: 2/5 quads with AAROM at knee -10 - 40    Cervical / Trunk Assessment: Normal  Communication   Communication: No difficulties  Cognition Arousal/Alertness: Awake/alert Behavior During Therapy: WFL for tasks assessed/performed Overall Cognitive Status: Within Functional Limits for tasks assessed                      General Comments      Exercises Total Joint Exercises Ankle Circles/Pumps: AAROM;Right;Supine;15 reps Quad Sets: AAROM;Right;10 reps;Supine Heel Slides: AAROM;Right;10 reps;Supine Straight Leg Raises: AAROM;Right;10 reps;Supine  Assessment/Plan    PT Assessment Patient needs continued PT services  PT Diagnosis Difficulty walking   PT Problem List Decreased strength;Decreased range of motion;Decreased activity tolerance;Decreased mobility;Decreased knowledge of use of DME;Pain  PT Treatment Interventions DME instruction;Gait training;Stair training;Functional mobility training;Therapeutic activities;Therapeutic exercise;Patient/family education   PT Goals (Current goals can be found in  the Care Plan section) Acute Rehab PT Goals Patient Stated Goal: Resume previous lifestyle with decreased pain PT Goal Formulation: With patient Time For Goal Achievement: 03/07/15 Potential to Achieve Goals: Good    Frequency 7X/week   Barriers to discharge        Co-evaluation               End of Session Equipment Utilized During Treatment: Gait belt;Right knee immobilizer Activity Tolerance: Other (comment) (nausea, dizziness - BP 141/67) Patient left: in chair;with call bell/phone within reach;with family/visitor present Nurse Communication: Mobility status         Time: [id number] PT Time Calculation (min) (ACUTE ONLY): 40 min   Charges:   PT Evaluation $PT Eval Low Complexity: 1 Procedure PT Treatments $Gait Training: 8-22 mins $Therapeutic Exercise: 8-22 mins   PT G Codes:        Lindsay Jimenez 2015-03-29, 12:52 PM

## 2015-03-03 NOTE — Progress Notes (Signed)
OT Cancellation Note  Patient Details Name: Lindsay Jimenez MRN: [id number] DOB: 06/22/1950   Cancelled Treatment:    Reason Eval/Treat Not Completed: Medical issues which prohibited therapy (Pt with nausea, awaiting medication. Will follow.)  [name] 03/03/2015, 2:16 PM  [phone]

## 2015-03-03 NOTE — Progress Notes (Signed)
Patient ID: Lindsay Jimenez, female   DOB: [birth year], 65 y.o.   MRN: [id number] Postoperative day 1 status post total knee arthroplasty. Plan for discharge to home on Sunday if safe with therapy.

## 2015-03-04 MED ORDER — ASPIRIN 325 MG PO TABS
325.0000 mg | ORAL_TABLET | Freq: Every day | ORAL | Status: DC
  Filled 2015-03-04: qty 1

## 2015-03-04 MED ORDER — ASPIRIN 325 MG PO TABS
325.0000 mg | ORAL_TABLET | Freq: Two times a day (BID) | ORAL | Status: DC
  Administered 2015-03-05: 325 mg via ORAL
  Filled 2015-03-04 (×2): qty 1

## 2015-03-04 NOTE — Progress Notes (Signed)
Physical Therapy Treatment Patient Details Name: Lindsay Jimenez MRN: [id number] DOB: March 16, 1950 Today's Date: 03/04/2015    History of Present Illness R TKR; Pt s/p R THR 9/16    PT Comments    Continues to have dizziness and nausea when moving. Has not had pain meds since day before.   Follow Up Recommendations  Home health PT     Equipment Recommendations  None recommended by PT    Recommendations for Other Services       Precautions / Restrictions Precautions Precautions: Fall Precaution Comments: dizzy,nausea Required Braces or Orthoses: Knee Immobilizer - Right Knee Immobilizer - Right: Discontinue once straight leg raise with < 10 degree lag    Mobility  Bed Mobility   Bed Mobility: Supine to Sit       Sit to supine: Min assist   General bed mobility comments: cues for sequence and use of L LE to self assist  Transfers Overall transfer level: Needs assistance Equipment used: Rolling walker (2 wheeled) Transfers: Sit to/from Stand Sit to Stand: Min assist         General transfer comment: cues for LE management and use of UEs to self assist  Ambulation/Gait Ambulation/Gait assistance: Min assist;+2 safety/equipment Ambulation Distance (Feet): 40 Feet Assistive device: Rolling walker (2 wheeled) Gait Pattern/deviations: Step-to pattern Gait velocity: decr   General Gait Details: cues for sequence, posture and position from RW, became dizzy and nauseated, chair brought behind for safety. BP 117/64.   Stairs            Wheelchair Mobility    Modified Rankin (Stroke Patients Only)       Balance                                    Cognition Arousal/Alertness: Awake/alert                          Exercises Total Joint Exercises Ankle Circles/Pumps: AAROM;Supine;15 reps;AROM;Both Quad Sets: AROM;Both Short Arc Quad: AAROM;Right;10 reps;Supine Heel Slides: AAROM;Right;10 reps;Supine Hip ABduction/ADduction:  AROM;Right;10 reps;Supine Straight Leg Raises: AAROM;Right;10 reps;Supine Goniometric ROM: 10-40 r knee flex    General Comments        Pertinent Vitals/Pain Faces Pain Scale: Hurts little more Pain Location: R knee Pain Descriptors / Indicators: Aching;Tightness Pain Intervention(s): Limited activity within patient's tolerance;Repositioned;Ice applied    Home Living                      Prior Function            PT Goals (current goals can now be found in the care plan section) Progress towards PT goals: Progressing toward goals (but limited by dizziness  and nausea when up)    Frequency  7X/week    PT Plan Current plan remains appropriate    Co-evaluation             End of Session Equipment Utilized During Treatment: Right knee immobilizer Activity Tolerance: Treatment limited secondary to medical complications (Comment) (dizzy, nausea) Patient left: in chair;with call bell/phone within reach;with family/visitor present     Time: [id number] PT Time Calculation (min) (ACUTE ONLY): 36 min  Charges:  $Gait Training: 8-22 mins $Therapeutic Exercise: 8-22 mins                    G Codes:  Claretha Cooper 03/04/2015, 2:33 PM Tresa Endo PT [phone]

## 2015-03-04 NOTE — Progress Notes (Signed)
Physical Therapy Treatment Patient Details Name: Lindsay Jimenez MRN: [id number] DOB: 29-Apr-1950 Today's Date: 03/04/2015    History of Present Illness R TKR; Pt s/p R THR 9/16    PT Comments    Reports dizziness when getting to [facility], just returned to bed.   Follow Up Recommendations  Home health PT     Equipment Recommendations  None recommended by PT    Recommendations for Other Services       Precautions / Restrictions Precautions Precautions: Fall Precaution Comments: dizzy,nausea Required Braces or Orthoses: Knee Immobilizer - Right Knee Immobilizer - Right: Discontinue once straight leg raise with < 10 degree lag    Balance                                    Cognition Arousal/Alertness: Awake/alert                          Exercises Total Joint Exercises Ankle Circles/Pumps: AAROM;Supine;15 reps;AROM;Both Quad Sets: AROM;Both Short Arc Quad: AAROM;Right;10 reps;Supine Heel Slides: AAROM;Right;10 reps;Supine Hip ABduction/ADduction: AROM;Right;10 reps;Supine Straight Leg Raises: AAROM;Right;10 reps;Supine Goniometric ROM: 10-40 r knee flex    General Comments        Pertinent Vitals/Pain Pain Score: 2  Faces Pain Scale: Hurts little more Pain Location: R knee Pain Descriptors / Indicators: Discomfort Pain Intervention(s): Ice applied;Monitored during session    Home Living                      Prior Function            PT Goals (current goals can now be found in the care plan section) Progress towards PT goals: Progressing toward goals    Frequency  7X/week    PT Plan Current plan remains appropriate    Co-evaluation             End of Session Equipment Utilized During Treatment: Right knee immobilizer Activity Tolerance: Patient tolerated treatment well Patient left: in bed;with call bell/phone within reach;with bed alarm set;with family/visitor present     Time: [id number] PT Time Calculation  (min) (ACUTE ONLY): 15 min  Charges:  1 ther ex                    G CodesClaretha Cooper 03/04/2015, 2:37 PM Tresa Endo PT [phone]

## 2015-03-04 NOTE — Progress Notes (Signed)
Patient ID: Lindsay Jimenez, female   DOB: [birth year], 65 y.o.   MRN: [id number] Postoperative day 2 status post hemiarthroplasty. Patient is progressing slowly with therapy. She has been having difficulty with nausea. We will continue inpatient stay anticipate discharge to home on Monday.

## 2015-03-04 NOTE — Progress Notes (Signed)
Pt reported dizziness with severe itching. Severe itching with hives noted to lower aspect of both arms with redness. Pt also reported some redness with hives to thighs.  MD made aware. New order given to discontinue xarelto and order given for aspirin. Order placed and pt made aware. Vwilliams,rn.

## 2015-03-04 NOTE — Evaluation (Signed)
Occupational Therapy Evaluation Patient Details Name: Lindsay Jimenez MRN: [id number] DOB: 1950-10-28 Today's Date: 03/04/2015    History of Present Illness R TKR; Pt s/p R THR 9/16   Clinical Impression   Pt was independent prior to admission.  Presents with nausea, weakness, decreased activity tolerance, itching/hives and impaired balance. Pt will rely on her family (brother and sister in law to stay with pt indefinitely)  to assist with LB dressing until she is able.  Educated to dress operated leg first and undress last and in use of long bath sponge.  Pt instructed in safe footwear and transporting items safely with RW.  Pt is motivated, but limited by nausea with activity.  Will follow.    Follow Up Recommendations  No OT follow up;Supervision/Assistance - 24 hour    Equipment Recommendations  None recommended by OT    Recommendations for Other Services       Precautions / Restrictions Precautions Precautions: Fall Required Braces or Orthoses: Knee Immobilizer - Right Knee Immobilizer - Right: Discontinue once straight leg raise with < 10 degree lag Restrictions Weight Bearing Restrictions: No Other Position/Activity Restrictions: WBAT      Mobility Bed Mobility  Pt in chair.                Transfers Overall transfer level: Needs assistance Equipment used: Rolling walker (2 wheeled) Transfers: Sit to/from [facility] Sit to Stand: Min assist Stand pivot transfers: Min assist            Balance                                            ADL Overall ADL's : Needs assistance/impaired Eating/Feeding: Independent;Sitting   Grooming: Wash/dry hands;Wash/dry face;Sitting;Set up   Upper Body Bathing: Set up;Sitting   Lower Body Bathing: Minimal assistance;Sit to/from stand   Upper Body Dressing : Set up;Sitting   Lower Body Dressing: Minimal assistance;Sit to/from stand   Toilet Transfer: Minimal  assistance;Stand-pivot;RW;BSC   Toileting- Clothing Manipulation and Hygiene: Minimal assistance;Sit to/from stand       Functional mobility during ADLs: Minimal assistance;Rolling walker General ADL Comments: Pt limited by weakness and nausea with ambulation, pivoted to 3 in 1 instead of walking to bathroom and sat to perform grooming.  Pt is familiar with AE, but plans to rely on her family until she is able to perform LB ADL independently.      Vision     Perception     Praxis      Pertinent Vitals/Pain Pain Assessment: Faces Faces Pain Scale: Hurts little more Pain Location: R knee Pain Descriptors / Indicators: Sore Pain Intervention(s): Monitored during session;Repositioned;Limited activity within patient's tolerance     Hand Dominance Right   Extremity/Trunk Assessment Upper Extremity Assessment Upper Extremity Assessment: Overall WFL for tasks assessed   Lower Extremity Assessment Lower Extremity Assessment: Defer to PT evaluation       Communication Communication Communication: No difficulties   Cognition Arousal/Alertness: Awake/alert Behavior During Therapy: WFL for tasks assessed/performed Overall Cognitive Status: Within Functional Limits for tasks assessed                     General Comments       Exercises       Shoulder Instructions      Home Living Family/patient expects to be discharged to:: Private  residence Living Arrangements: Alone Available Help at Discharge: Family;Available 24 hours/day (brother and his wife) Type of Home: House Home Access: Ramped entrance     Home Layout: Able to live on main level with bedroom/bathroom     Bathroom Shower/Tub: Hospital doctor Toilet: Handicapped height     Home Equipment: Environmental consultant - 2 wheels;Bedside commode;Shower seat - built in          Prior Functioning/Environment Level of Independence: Independent             OT Diagnosis:     OT Problem List:  Decreased strength;Decreased activity tolerance;Impaired balance (sitting and/or standing);Decreased knowledge of use of DME or AE;Pain   OT Treatment/Interventions: Self-care/ADL training;Patient/family education;DME and/or AE instruction    OT Goals(Current goals can be found in the care plan section) Acute Rehab OT Goals Patient Stated Goal: Resume previous lifestyle with decreased pain OT Goal Formulation: With patient Time For Goal Achievement: 03/11/15 Potential to Achieve Goals: Good ADL Goals Pt Will Perform Grooming: with supervision;standing Pt Will Transfer to Toilet: with supervision;ambulating;bedside commode (over toilet) Pt Will Perform Toileting - Clothing Manipulation and hygiene: with supervision;sit to/from stand Pt Will Perform Tub/Shower Transfer: Shower transfer;with min guard assist;ambulating;shower seat;rolling walker  OT Frequency: Min 2X/week   Barriers to D/C:            Co-evaluation              End of Session Equipment Utilized During Treatment: Gait belt;Rolling walker;Right knee immobilizer Nurse Communication:  (wants Pepcid)  Activity Tolerance: Treatment limited secondary to medical complications (Comment) (nausea, itching) Patient left: in chair;with call bell/phone within reach;with family/visitor present   Time: 1020-1040 OT Time Calculation (min): 20 min Charges:  OT General Charges $OT Visit: 1 Procedure OT Evaluation $OT Eval Moderate Complexity: 1 Procedure G-Codes:    Lindsay Jimenez 03/04/2015, 10:45 AM  [phone]

## 2015-03-05 MED ORDER — ASPIRIN 325 MG PO TABS
325.0000 mg | ORAL_TABLET | Freq: Two times a day (BID) | ORAL | Status: DC
Start: 2015-03-05 — End: 2018-11-05

## 2015-03-05 NOTE — Progress Notes (Signed)
Subjective: 3 Days Post-Op Procedure(s) (LRB): RIGHT TOTAL KNEE ARTHROPLASTY (Right) Patient reports pain as moderate.  Itching this AM< feels secondary to Xarelto.   Objective: Vital signs in last 24 hours: Temp:  [99.1 F (37.3 C)-101.3 F (38.5 C)] 99.5 F (37.5 C) (01/23 0649) Pulse Rate:  [113-123] 113 (01/23 0649) Resp:  [16-18] 18 (01/23 0649) BP: (125-138)/(64-87) 128/68 mmHg (01/23 0649) SpO2:  [93 %-94 %] 94 % (01/23 0649)  Intake/Output from previous day: 01/22 0701 - 01/23 0700 In: 1080 [P.O.:1080] Out: -  Intake/Output this shift:     Recent Labs  03/03/15 0434  HGB 10.8*    Recent Labs  03/03/15 0434  WBC 14.2*  RBC 3.79*  HCT 33.3*  PLT 234    Recent Labs  03/03/15 0434  NA 135  K 5.2*  CL 98*  CO2 29  BUN 14  CREATININE 0.62  GLUCOSE 155*  CALCIUM 9.1   No results for input(s): LABPT, INR in the last 72 hours.  Intact pulses distally Dorsiflexion/Plantar flexion intact Incision: dressing C/D/I Compartment soft  Assessment/Plan: 3 Days Post-Op Procedure(s) (LRB): RIGHT TOTAL KNEE ARTHROPLASTY (Right) Discharge home with home health  Discontinue Xarelto Start ASA for DVT prophylaxis  Lindsay Jimenez 03/05/2015, 8:08 AM

## 2015-03-05 NOTE — Progress Notes (Signed)
Physical Therapy Treatment Patient Details Name: Lindsay Jimenez MRN: [id number] DOB: [birth year] Today's Date: 03/05/2015    History of Present Illness R TKR; Pt s/p R THR 9/16    PT Comments    POD # 3 Applied KI and instructed on use.  Assisted with amb a greater distance.  No stairs as pt has a ramp enterance. Performed all supine TKR TE's following HEP handout.  Applied ICE and instructed on use. Pt ready for D/C to home.  Follow Up Recommendations  Home health PT     Equipment Recommendations  3in1 (PT) (has a walker)    Recommendations for Other Services       Precautions / Restrictions Precautions Precautions: Fall Precaution Comments: instructed on use of KI Required Braces or Orthoses: Knee Immobilizer - Right Knee Immobilizer - Right: Discontinue once straight leg raise with < 10 degree lag Restrictions Weight Bearing Restrictions: No Other Position/Activity Restrictions: WBAT    Mobility  Bed Mobility               General bed mobility comments: Pt OOB in recliner  Transfers Overall transfer level: Needs assistance Equipment used: Rolling walker (2 wheeled) Transfers: Sit to/from Stand Sit to Stand: Supervision         General transfer comment: good safety cognition and tech   Ambulation/Gait Ambulation/Gait assistance: Supervision Ambulation Distance (Feet): 75 Feet Assistive device: Rolling walker (2 wheeled) Gait Pattern/deviations: Step-to pattern;Step-through pattern;Decreased stance time - right Gait velocity: decr   General Gait Details: <25% VC's on safety with turns   Science writer    Modified Rankin (Stroke Patients Only)       Balance                                    Cognition Arousal/Alertness: Awake/alert Behavior During Therapy: WFL for tasks assessed/performed Overall Cognitive Status: Within Functional Limits for tasks assessed                       Exercises   Total Knee Replacement TE's 10 reps B LE ankle pumps 10 reps towel squeezes 10 reps knee presses 10 reps heel slides  10 reps SAQ's 10 reps SLR's 10 reps ABD Followed by ICE     General Comments        Pertinent Vitals/Pain Pain Assessment: 0-10 Pain Score: 6  Pain Location: R knee Pain Descriptors / Indicators: Tender;Sore Pain Intervention(s): Monitored during session;Premedicated before session;Repositioned;Ice applied    Home Living                      Prior Function            PT Goals (current goals can now be found in the care plan section) Progress towards PT goals: Progressing toward goals    Frequency  7X/week    PT Plan Current plan remains appropriate    Co-evaluation             End of Session Equipment Utilized During Treatment: Right knee immobilizer Activity Tolerance: Patient tolerated treatment well Patient left: in chair;with call bell/phone within reach;with family/visitor present     Time: 0900-0940 PT Time Calculation (min) (ACUTE ONLY): 40 min  Charges:  $Gait Training: 8-22 mins $Therapeutic Exercise: 8-22 mins $Therapeutic Activity: 8-22 mins  G Codes:      Rica Koyanagi  PTA [facility]  Acute  Rehab Pager      [phone]

## 2015-03-05 NOTE — Care Management Note (Signed)
Case Management Note  Patient Details  Name: NUNZIATA BRANUM MRN: [id number] Date of Birth: 08/07/1950  Subjective/Objective:   Status post total right knee replacement                 Action/Plan: Discharge planning, Have chosen [name] for [facility] PT. Contacted [name] for referral. Needs 3-n-1, contacted [facility] to deliver to room.  Expected Discharge Date:                  Expected Discharge Plan:  [place]  In-House Referral:  NA  Discharge planning Services  CM Consult  Post Acute Care Choice:  Durable Medical Equipment, Home Health Choice offered to:  Patient  DME Arranged:  Walker rolling DME Agency:  [facility] Arranged:  PT HH Agency:  [facility]  Status of Service:  Completed, signed off  Medicare Important Message Given:    Date Medicare IM Given:    Medicare IM give by:    Date Additional Medicare IM Given:    Additional Medicare Important Message give by:     If discussed at [place] of Stay Meetings, dates discussed:    Additional Comments:  Guadalupe Maple, RN 03/05/2015, 11:04 AM [phone]

## 2015-03-05 NOTE — Discharge Summary (Signed)
Patient ID: Lindsay Jimenez MRN: [id number] DOB/AGE: [birth year] 65 y.o.  Admit date: 03/02/2015 Discharge date: 03/05/2015  Admission Diagnoses:  Principal Problem:   Osteoarthritis of right knee Active Problems:   Status post total right knee replacement   Discharge Diagnoses:  Same  Past Medical History  Diagnosis Date  . GERD (gastroesophageal reflux disease)     occasional  . Arthritis     oa  . PONV (postoperative nausea and vomiting)   . Breast CA ([place]) 2002    bilateral  . Family history of adverse reaction to anesthesia     aunt had problem , not sure what problem  was    Surgeries: Procedure(s): RIGHT TOTAL KNEE ARTHROPLASTY on 03/02/2015   Consultants:  PT  Discharged Condition: Improved  Hospital Course: Lindsay Jimenez is an 65 y.o. female who was admitted 03/02/2015 for operative treatment ofOsteoarthritis of right knee. Patient has severe unremitting pain that affects sleep, daily activities, and work/hobbies. After pre-op clearance the patient was taken to the operating room on 03/02/2015 and underwent  Procedure(s): RIGHT TOTAL KNEE ARTHROPLASTY.    Patient was given perioperative antibiotics: Anti-infectives    Start     Dose/Rate Route Frequency Ordered Stop   03/02/15 1600  ceFAZolin (ANCEF) IVPB 1 g/50 mL premix     1 g 100 mL/hr over 30 Minutes Intravenous Every 6 hours 03/02/15 1353 03/02/15 2209   03/02/15 0810  ceFAZolin (ANCEF) IVPB 2 g/50 mL premix     2 g 100 mL/hr over 30 Minutes Intravenous On call to O.R. 03/02/15 [id number] 03/02/15 0949       Patient was given sequential compression devices, early ambulation, and chemoprophylaxis to prevent DVT.  Patient benefited maximally from hospital stay and there were no complications.    Recent vital signs: Patient Vitals for the past 24 hrs:  BP Temp Temp src Pulse Resp SpO2  03/05/15 0649 128/68 mmHg 99.5 F (37.5 C) Oral (!) 113 18 94 %  03/04/15 2131 125/64 mmHg (!) 100.7 F (38.2 C) Oral (!)  117 16 93 %  03/04/15 1902 - (!) 101.3 F (38.5 C) - - - -  03/04/15 1346 138/87 mmHg 99.1 F (37.3 C) - (!) 123 16 94 %     Recent laboratory studies:  Recent Labs  03/03/15 0434  WBC 14.2*  HGB 10.8*  HCT 33.3*  PLT 234  NA 135  K 5.2*  CL 98*  CO2 29  BUN 14  CREATININE 0.62  GLUCOSE 155*  CALCIUM 9.1     Discharge Medications:     Medication List    STOP taking these medications        ibuprofen 200 MG tablet  Commonly known as:  ADVIL,MOTRIN      TAKE these medications        aspirin 325 MG tablet  Take 1 tablet (325 mg total) by mouth 2 (two) times daily.     CALCIUM CITRATE + D PO  Take 400 mg by mouth 2 (two) times daily.     docusate sodium 100 MG capsule  Commonly known as:  COLACE  Take 1 capsule (100 mg total) by mouth 2 (two) times daily.     EMERGEN-C VITAMIN C PO  Take 1 packet by mouth daily. During cold     famotidine 20 MG tablet  Commonly known as:  PEPCID  Take 20 mg by mouth daily as needed for heartburn or indigestion.     fluticasone 50 MCG/ACT  nasal spray  Commonly known as:  FLONASE  Place 1 spray into both nostrils daily as needed for allergies or rhinitis.     methocarbamol 500 MG tablet  Commonly known as:  ROBAXIN  Take 1 tablet (500 mg total) by mouth every 6 (six) hours as needed for muscle spasms.     multivitamin with minerals Tabs tablet  Take 1 tablet by mouth every morning.     ondansetron 4 MG tablet  Commonly known as:  ZOFRAN  Take 1 tablet (4 mg total) by mouth every 6 (six) hours as needed for nausea.     oxyCODONE-acetaminophen 5-325 MG tablet  Commonly known as:  ROXICET  Take 1-2 tablets by mouth every 4 (four) hours as needed.     promethazine 12.5 MG tablet  Commonly known as:  PHENERGAN  Take 1 tablet (12.5 mg total) by mouth every 6 (six) hours as needed for nausea or vomiting.     SYSTANE OP  Place 1 drop into both eyes at bedtime as needed (for eye dryness).        Diagnostic Studies:  Dg Knee Right Port  03/02/2015  CLINICAL DATA:  65 year old female status post right total knee replacement. EXAM: PORTABLE RIGHT KNEE - 1-2 VIEW COMPARISON:  No priors. FINDINGS: Postoperative changes of right total knee arthroplasty are noted. The femoral and tibial components of the prosthesis appear well seated without definite periprosthetic fracture or other immediate complicating features. Alignment appears anatomic. Gas in the joint space and the overlying soft tissues. IMPRESSION: 1. Expected postoperative appearance following right total knee arthroplasty. Electronically Signed   By: Vinnie Langton M.D.   On: 03/02/2015 12:35    Disposition: 06-Home-Health Care Svc        Follow-up Information    Follow up with Mcarthur Rossetti, MD In 2 weeks.   Specialty:  Orthopedic Surgery   Contact information:   [place] [address] [phone]        Signed: Erskine Emery 03/05/2015, 8:10 AM

## 2015-03-05 NOTE — Progress Notes (Signed)
Occupational Therapy Treatment Patient Details Name: Lindsay Jimenez MRN: [id number] DOB: [birth year] Today's Date: 03/05/2015    History of present illness R TKR; Pt s/p R THR 9/16   OT comments  Brother will obtain grab bar for pt this day to increase safety in shower           Precautions / Restrictions Precautions Precautions: Fall Required Braces or Orthoses: Knee Immobilizer - Right Knee Immobilizer - Right: Discontinue once straight leg raise with < 10 degree lag Restrictions Weight Bearing Restrictions: No Other Position/Activity Restrictions: WBAT              ADL Overall ADL's : Needs assistance/impaired                                       General ADL Comments: Ot sesion focused on safety in pts bathroom - specifically the shower. OT educated pt and brother on suction cup grab bar ( safety, installation and position ) as pt voiced concern regarding shower.  Pts states her shower is a solid surface.  OT educated pt grab bar good addtion for fall prevention in the shower.   All pts questions answered.                  Cognition   Behavior During Therapy: WFL for tasks assessed/performed Overall Cognitive Status: Within Functional Limits for tasks assessed                                                     End of Session     Activity Tolerance Patient tolerated treatment well   Patient Left in chair;with family/visitor present   Nurse Communication          Time: 1000-1011 OT Time Calculation (min): 11 min  Charges: OT General Charges $OT Visit: 1 Procedure OT Treatments $Self Care/Home Management : 8-22 mins  Evyn Kooyman, Edwena Felty D 03/05/2015, 10:22 AM

## 2015-03-05 NOTE — Progress Notes (Signed)
Pt able to ambulate to BR with front wheel walker, minimal assist needed. Pt tolerated fine, pain well managed at this time. Family friend at the bedside. Call light and phone placed in reach. Monitoring continued per M.D. Orders and unit protocol.

## 2015-12-18 ENCOUNTER — Ambulatory Visit (INDEPENDENT_AMBULATORY_CARE_PROVIDER_SITE_OTHER): Payer: BC Managed Care – PPO | Admitting: Orthopaedic Surgery

## 2015-12-18 DIAGNOSIS — M25562 Pain in left knee: Secondary | ICD-10-CM

## 2015-12-18 DIAGNOSIS — G8929 Other chronic pain: Secondary | ICD-10-CM

## 2015-12-18 DIAGNOSIS — M1712 Unilateral primary osteoarthritis, left knee: Secondary | ICD-10-CM

## 2015-12-18 MED ORDER — METHYLPREDNISOLONE ACETATE 40 MG/ML IJ SUSP
40.0000 mg | INTRAMUSCULAR | Status: AC | PRN
  Administered 2015-12-18: 40 mg via INTRA_ARTICULAR

## 2015-12-18 MED ORDER — LIDOCAINE HCL 1 % IJ SOLN
3.0000 mL | INTRAMUSCULAR | Status: AC | PRN
  Administered 2015-12-18: 3 mL

## 2015-12-18 NOTE — Progress Notes (Signed)
Office Visit Note   Patient: Lindsay Jimenez           Date of Birth: 02-Mar-1950           MRN: [id number] Visit Date: 12/18/2015              Requested by: No referring provider defined for this encounter. PCP: Pcp Not In System   Assessment & Plan: Visit Diagnoses:  1. Chronic pain of left knee   2. Unilateral primary osteoarthritis, left knee     Plan: She tolerated the steroid injection well and her left knee. We'll see her back in about 4 months to see how she is doing overall this will be before the start of the spring softball season. I would like a new standing AP and lateral of her left knee at that visit.  Follow-Up Instructions: Return in about 4 months (around 04/16/2016).   Orders:  Orders Placed This Encounter  Procedures  . Large Joint Injection/Arthrocentesis   No orders of the defined types were placed in this encounter.     Procedures: Large Joint Inj Date/Time: 12/18/2015 10:14 AM Performed by: Mcarthur Rossetti Authorized by: Mcarthur Rossetti   Indications:  Pain Location:  Knee Needle Size:  22 G Approach:  Anterolateral Ultrasound Guidance: No   Fluoroscopic Guidance: No   Arthrogram: No Medications:  3 mL lidocaine 1 %; 40 mg methylPREDNISolone acetate 40 MG/ML     Clinical Data: No additional findings.   Subjective: Chief Complaint  Patient presents with  . Right Knee - Follow-up    Patient states right knee is doing very good. Good ROM and strength  . Left Knee - Follow-up    States injection she had 08/21/15 was very helpful and would like another.  She says her right total hip in her right total knee or do well he finished softball season on a high note.  HPI  Review of Systems Negative for chest pain, shortness breath, fevers, chills, nausea, vomiting.  Objective: Vital Signs: There were no vitals taken for this visit.  Physical Exam  Ortho Exam Her right hip exam in her right knee exam showed no problems.  Her left knee has medial joint line tenderness with full range of motion. She has a mild varus deformity of the left knee that is easily correctable. Her knee feels ligaments stable. Specialty Comments:  No specialty comments available.  Imaging: No results found.   PMFS History: Patient Active Problem List   Diagnosis Date Noted  . Osteoarthritis of right knee 03/02/2015  . Status post total right knee replacement 03/02/2015  . Osteoarthritis of right hip 10/20/2014  . Status post total replacement of right hip 10/20/2014   Past Medical History:  Diagnosis Date  . Arthritis    oa  . Breast CA ([place]) 2002   bilateral  . Family history of adverse reaction to anesthesia    aunt had problem , not sure what problem  was  . GERD (gastroesophageal reflux disease)    occasional  . PONV (postoperative nausea and vomiting)     No family history on file.  Past Surgical History:  Procedure Laterality Date  . ablation to both knees  april and may 2016   to deaden nerves  . KNEE SURGERY Right 1970   cartlidge removed  . MASTECTOMY Bilateral 2002  . OOPHORECTOMY Bilateral 2004  . TOTAL HIP ARTHROPLASTY Right 10/20/2014   Procedure: RIGHT TOTAL HIP ARTHROPLASTY ANTERIOR APPROACH;  Surgeon:  Mcarthur Rossetti, MD;  Location: [facility];  Service: Orthopedics;  Laterality: Right;  . TOTAL KNEE ARTHROPLASTY Right 03/02/2015   Procedure: RIGHT TOTAL KNEE ARTHROPLASTY;  Surgeon: Mcarthur Rossetti, MD;  Location: [facility];  Service: Orthopedics;  Laterality: Right;   Social History   Occupational History  . Not on file.   Social History Main Topics  . Smoking status: Never Smoker  . Smokeless tobacco: Never Used  . Alcohol use No  . Drug use: No  . Sexual activity: Not on file

## 2016-04-16 ENCOUNTER — Ambulatory Visit (INDEPENDENT_AMBULATORY_CARE_PROVIDER_SITE_OTHER): Payer: Self-pay

## 2016-04-16 ENCOUNTER — Ambulatory Visit (INDEPENDENT_AMBULATORY_CARE_PROVIDER_SITE_OTHER): Payer: BC Managed Care – PPO | Admitting: Physician Assistant

## 2016-04-16 DIAGNOSIS — M1712 Unilateral primary osteoarthritis, left knee: Secondary | ICD-10-CM | POA: Diagnosis not present

## 2016-04-16 DIAGNOSIS — G8929 Other chronic pain: Secondary | ICD-10-CM | POA: Diagnosis not present

## 2016-04-16 DIAGNOSIS — M25562 Pain in left knee: Secondary | ICD-10-CM | POA: Diagnosis not present

## 2016-04-16 MED ORDER — METHYLPREDNISOLONE ACETATE 40 MG/ML IJ SUSP
40.0000 mg | INTRAMUSCULAR | Status: AC | PRN
  Administered 2016-04-16: 40 mg via INTRA_ARTICULAR

## 2016-04-16 MED ORDER — LIDOCAINE HCL 1 % IJ SOLN
3.0000 mL | INTRAMUSCULAR | Status: AC | PRN
  Administered 2016-04-16: 3 mL

## 2016-04-16 NOTE — Progress Notes (Signed)
Office Visit Note   Patient: Lindsay Jimenez           Date of Birth: Aug 17, 1950           MRN: [id number] Visit Date: 04/16/2016              Requested by: No referring provider defined for this encounter. PCP: Pcp Not In System   Assessment & Plan: Visit Diagnoses:  1. Chronic pain of left knee   2. Unilateral primary osteoarthritis, left knee     Plan: Follow-up once supplemental injection is available. Discussed with her affected the way in all conservative measures fail that she would require a total knee arthroplasty were now she would rather stay away from knee replacement at least until the fall.  Follow-Up Instructions: Return for Supplemental injection.   Orders:  Orders Placed This Encounter  Procedures  . Large Joint Injection/Arthrocentesis  . XR Knee 1-2 Views Left   No orders of the defined types were placed in this encounter.     Procedures: Large Joint Inj Date/Time: 04/16/2016 10:15 AM Performed by: Pete Pelt Authorized by: Pete Pelt   Consent Given by:  Patient Indications:  Pain Location:  Knee Site:  L knee Needle Size:  22 G Approach:  Anterolateral Ultrasound Guidance: No   Fluoroscopic Guidance: No   Medications:  40 mg methylPREDNISolone acetate 40 MG/ML; 3 mL lidocaine 1 % Aspiration Attempted: No   Patient tolerance:  Patient tolerated the procedure well with no immediate complications     Clinical Data: No additional findings.   Subjective: No chief complaint on file.   HPI Lindsay Jimenez comes in today due to increasing left knee pain. She states that the supplemental injection last spring helped for quite a while and then she had a cortisone injection in November of this last year and that that helped until recently. She is now having increased pain in the knee. She's had no new injury to the knee. Her right knee which she had a total knee arthroplasty performed on 03/02/15 is doing well. Review of  Systems   Objective: Vital Signs: There were no vitals taken for this visit.  Physical Exam  Constitutional: She is oriented to person, place, and time. She appears well-developed and well-nourished. No distress.  Neurological: She is alert and oriented to person, place, and time.  Psychiatric: She has a normal mood and affect. Her behavior is normal.    Ortho Exam Knee she has excellent range of motion the knee without pain. Left knee she has good range of motion. No instability valgus varus stressing. No abnormal warmth no erythema no effusion she has tenderness along the medial joint line with palpation. Specialty Comments:  No specialty comments available.  Imaging: Xr Knee 1-2 Views Left  Result Date: 04/16/2016 AP view bilateral knees a lateral view of the left knee: No acute fractures no bony abnormalities. Right total knee is well-seated without any signs of complication. Left knee near bone-on-bone medial compartment. Lateral compartment with mild early arthritic changes. Moderate patellofemoral changes.    PMFS History: Patient Active Problem List   Diagnosis Date Noted  . Osteoarthritis of right knee 03/02/2015  . Status post total right knee replacement 03/02/2015  . Osteoarthritis of right hip 10/20/2014  . Status post total replacement of right hip 10/20/2014   Past Medical History:  Diagnosis Date  . Arthritis    oa  . Breast CA ([place]) 2002   bilateral  .  Family history of adverse reaction to anesthesia    aunt had problem , not sure what problem  was  . GERD (gastroesophageal reflux disease)    occasional  . PONV (postoperative nausea and vomiting)     No family history on file.  Past Surgical History:  Procedure Laterality Date  . ablation to both knees  april and may 2016   to deaden nerves  . KNEE SURGERY Right 1970   cartlidge removed  . MASTECTOMY Bilateral 2002  . OOPHORECTOMY Bilateral 2004  . TOTAL HIP ARTHROPLASTY Right 10/20/2014    Procedure: RIGHT TOTAL HIP ARTHROPLASTY ANTERIOR APPROACH;  Surgeon: Mcarthur Rossetti, MD;  Location: [facility];  Service: Orthopedics;  Laterality: Right;  . TOTAL KNEE ARTHROPLASTY Right 03/02/2015   Procedure: RIGHT TOTAL KNEE ARTHROPLASTY;  Surgeon: Mcarthur Rossetti, MD;  Location: [facility];  Service: Orthopedics;  Laterality: Right;   Social History   Occupational History  . Not on file.   Social History Main Topics  . Smoking status: Never Smoker  . Smokeless tobacco: Never Used  . Alcohol use No  . Drug use: No  . Sexual activity: Not on file

## 2016-04-24 ENCOUNTER — Telehealth (INDEPENDENT_AMBULATORY_CARE_PROVIDER_SITE_OTHER): Payer: Self-pay | Admitting: Radiology

## 2016-04-24 NOTE — Telephone Encounter (Signed)
Advised patient that synvisc injection for left knee was approved 100% buy and bill. Made appt 04/30/16 with GC at 345pm. Injection logged in drawer by Dr. Trevor Mace desk.

## 2016-04-30 ENCOUNTER — Ambulatory Visit (INDEPENDENT_AMBULATORY_CARE_PROVIDER_SITE_OTHER): Payer: BC Managed Care – PPO | Admitting: Physician Assistant

## 2016-04-30 ENCOUNTER — Encounter (INDEPENDENT_AMBULATORY_CARE_PROVIDER_SITE_OTHER): Payer: Self-pay | Admitting: Physician Assistant

## 2016-04-30 DIAGNOSIS — M1712 Unilateral primary osteoarthritis, left knee: Secondary | ICD-10-CM | POA: Diagnosis not present

## 2016-04-30 MED ORDER — HYLAN G-F 20 48 MG/6ML IX SOSY
48.0000 mg | PREFILLED_SYRINGE | INTRA_ARTICULAR | Status: AC | PRN
  Administered 2016-04-30: 48 mg via INTRA_ARTICULAR

## 2016-04-30 NOTE — Progress Notes (Signed)
Office Visit Note   Patient: Lindsay Jimenez           Date of Birth: [birth year]           MRN: [id number] Visit Date: 04/30/2016              Requested by: No referring provider defined for this encounter. PCP: Pcp Not In System   Assessment & Plan: Visit Diagnoses:  1. Primary osteoarthritis of left knee     Plan: See her back in October of this yeartosee over how she is overall doing and discuss if she wants to continue conservative treatment or proceed with total knee arthroplasty. She understands she can have cortisone injections in no greater than every 3 months and supplemental injections every 6 months.  Follow-Up Instructions: Return in about 7 months (around 11/30/2016).   Orders:  Orders Placed This Encounter  Procedures  . Large Joint Injection/Arthrocentesis   No orders of the defined types were placed in this encounter.     Procedures: Large Joint Inj Date/Time: 04/30/2016 4:09 PM Performed by: Pete Pelt Authorized by: Pete Pelt   Consent Given by:  Patient Indications:  Pain Location:  Knee Site:  L knee Needle Size:  22 G Approach:  Anterolateral Ultrasound Guidance: No   Fluoroscopic Guidance: No   Medications:  48 mg Hylan 48 MG/6ML Aspiration Attempted: No   Patient tolerance:  Patient tolerated the procedure well with no immediate complications     Clinical Data: No additional findings.   Subjective: Chief Complaint  Patient presents with  . Left Knee - Pain    Patient presents for Synvisc One Injection into left knee today. She had cortisone injection in the left knee on 04/16/2016 which provided relief. She states that her knee is stiff today.     Review of Systems   Objective: Vital Signs: There were no vitals taken for this visit.  Physical Exam  Ortho Exam Good range of motion left.  No effusion, rashes skin lesions erythema left knee. Specialty Comments:  No specialty comments available.  Imaging: No  results found.   PMFS History: Patient Active Problem List   Diagnosis Date Noted  . Osteoarthritis of right knee 03/02/2015  . Status post total right knee replacement 03/02/2015  . Osteoarthritis of right hip 10/20/2014  . Status post total replacement of right hip 10/20/2014   Past Medical History:  Diagnosis Date  . Arthritis    oa  . Breast CA ([place]) 2002   bilateral  . Family history of adverse reaction to anesthesia    aunt had problem , not sure what problem  was  . GERD (gastroesophageal reflux disease)    occasional  . PONV (postoperative nausea and vomiting)     No family history on file.  Past Surgical History:  Procedure Laterality Date  . ablation to both knees  april and may 2016   to deaden nerves  . KNEE SURGERY Right 1970   cartlidge removed  . MASTECTOMY Bilateral 2002  . OOPHORECTOMY Bilateral 2004  . TOTAL HIP ARTHROPLASTY Right 10/20/2014   Procedure: RIGHT TOTAL HIP ARTHROPLASTY ANTERIOR APPROACH;  Surgeon: Mcarthur Rossetti, MD;  Location: [facility];  Service: Orthopedics;  Laterality: Right;  . TOTAL KNEE ARTHROPLASTY Right 03/02/2015   Procedure: RIGHT TOTAL KNEE ARTHROPLASTY;  Surgeon: Mcarthur Rossetti, MD;  Location: [facility];  Service: Orthopedics;  Laterality: Right;   Social History   Occupational History  . Not on  file.   Social History Main Topics  . Smoking status: Never Smoker  . Smokeless tobacco: Never Used  . Alcohol use No  . Drug use: No  . Sexual activity: Not on file

## 2016-08-21 ENCOUNTER — Ambulatory Visit (INDEPENDENT_AMBULATORY_CARE_PROVIDER_SITE_OTHER): Payer: BC Managed Care – PPO | Admitting: Orthopaedic Surgery

## 2016-08-21 DIAGNOSIS — M25562 Pain in left knee: Secondary | ICD-10-CM | POA: Diagnosis not present

## 2016-08-21 DIAGNOSIS — M1712 Unilateral primary osteoarthritis, left knee: Secondary | ICD-10-CM | POA: Diagnosis not present

## 2016-08-21 DIAGNOSIS — G8929 Other chronic pain: Secondary | ICD-10-CM

## 2016-08-21 MED ORDER — LIDOCAINE HCL 1 % IJ SOLN
3.0000 mL | INTRAMUSCULAR | Status: AC | PRN
  Administered 2016-08-21: 3 mL

## 2016-08-21 MED ORDER — METHYLPREDNISOLONE ACETATE 40 MG/ML IJ SUSP
40.0000 mg | INTRAMUSCULAR | Status: AC | PRN
  Administered 2016-08-21: 40 mg via INTRA_ARTICULAR

## 2016-08-21 NOTE — Progress Notes (Signed)
   Procedure Note  Patient: Lindsay Jimenez             Date of Birth: [birth year]           MRN: [id number]             Visit Date: 08/21/2016  Procedures: Visit Diagnoses: No diagnosis found.  Large Joint Inj Date/Time: 08/21/2016 3:34 PM Performed by: Mcarthur Rossetti Authorized by: Mcarthur Rossetti   Location:  Knee Site:  L knee Ultrasound Guidance: No   Fluoroscopic Guidance: No   Arthrogram: No   Medications:  3 mL lidocaine 1 %; 40 mg methylPREDNISolone acetate 40 MG/ML   The patient is well-known to me. We performed a right total knee replacement on her as well as a right total hip arthroplasty on her. She comes in time to time for steroid injection her left knee due to known osteoarthritis and pain in that knee. Is been over 3 months since her last injection and she like to have one today since she is heading to the beach seen. There has been no other change in her medical status.  On examination of her left knee she has medial joint line tenderness only in some slight patellofemoral crepitation. There is slight varus malalignment.  I agree with her trying another steroid injection in her knee and she tolerated this well. She'll keep her next follow-up in October but no x-rays are needed.

## 2016-12-01 ENCOUNTER — Ambulatory Visit (INDEPENDENT_AMBULATORY_CARE_PROVIDER_SITE_OTHER): Payer: BC Managed Care – PPO | Admitting: Physician Assistant

## 2016-12-01 ENCOUNTER — Encounter (INDEPENDENT_AMBULATORY_CARE_PROVIDER_SITE_OTHER): Payer: Self-pay | Admitting: Physician Assistant

## 2016-12-01 DIAGNOSIS — M1712 Unilateral primary osteoarthritis, left knee: Secondary | ICD-10-CM | POA: Diagnosis not present

## 2016-12-01 MED ORDER — LIDOCAINE HCL 1 % IJ SOLN
3.0000 mL | INTRAMUSCULAR | Status: AC | PRN
  Administered 2016-12-01: 3 mL

## 2016-12-01 MED ORDER — METHYLPREDNISOLONE ACETATE 40 MG/ML IJ SUSP
40.0000 mg | INTRAMUSCULAR | Status: AC | PRN
  Administered 2016-12-01: 40 mg via INTRA_ARTICULAR

## 2016-12-01 NOTE — Progress Notes (Signed)
   Procedure Note  Patient: Lindsay Jimenez             Date of Birth: [birth year]           MRN: [id number]             Visit Date: 12/01/2016 Lindsay Jimenez is well-known to Dr. Delilah Shan service is known osteoarthritis of her left knee.  She comes in today requesting cortisone in the knee.  Had no known injury to the knee Procedures: Visit Diagnoses: Unilateral primary osteoarthritis, left knee  Large Joint Inj Date/Time: 12/01/2016 9:33 AM Performed by: Pete Pelt Authorized by: Pete Pelt   Consent Given by:  Patient Indications:  Pain Location:  Knee Site:  L knee Needle Size:  22 G Approach:  Anterolateral Ultrasound Guidance: No   Fluoroscopic Guidance: No   Medications:  40 mg methylPREDNISolone acetate 40 MG/ML; 3 mL lidocaine 1 % Aspiration Attempted: No   Patient tolerance:  Patient tolerated the procedure well with no immediate complications   Plan: We will see her back in 3 months for repeat injection in the left knee.

## 2017-03-16 ENCOUNTER — Ambulatory Visit (INDEPENDENT_AMBULATORY_CARE_PROVIDER_SITE_OTHER): Payer: BC Managed Care – PPO | Admitting: Physician Assistant

## 2017-03-16 ENCOUNTER — Encounter (INDEPENDENT_AMBULATORY_CARE_PROVIDER_SITE_OTHER): Payer: Self-pay | Admitting: Physician Assistant

## 2017-03-16 VITALS — Ht 67.0 in | Wt 195.0 lb

## 2017-03-16 DIAGNOSIS — M1712 Unilateral primary osteoarthritis, left knee: Secondary | ICD-10-CM | POA: Diagnosis not present

## 2017-03-16 MED ORDER — METHYLPREDNISOLONE ACETATE 40 MG/ML IJ SUSP
40.0000 mg | INTRAMUSCULAR | Status: AC | PRN
  Administered 2017-03-16: 40 mg via INTRA_ARTICULAR

## 2017-03-16 MED ORDER — LIDOCAINE HCL 1 % IJ SOLN
3.0000 mL | INTRAMUSCULAR | Status: AC | PRN
  Administered 2017-03-16: 3 mL

## 2017-03-16 NOTE — Progress Notes (Signed)
   Procedure Note  Patient: Lindsay Jimenez             Date of Birth: September 28, 1950           MRN: [id number]             Visit Date: 03/16/2017  HPI Lindsay Jimenez returns today for the left knee.  She has known osteoarthritis.  Status post injection 12/01/2016.  She got relief with the last injection but states that she was standing on her [employer]  bike "tweaked the knee".  Procedures: Visit Diagnoses: Unilateral primary osteoarthritis, left knee  Large Joint Inj: L knee on 03/16/2017 9:39 AM Indications: pain Details: 22 G 1.5 in needle, anterolateral approach  Arthrogram: No  Medications: 3 mL lidocaine 1 %; 40 mg methylPREDNISolone acetate 40 MG/ML Outcome: tolerated well, no immediate complications Procedure, treatment alternatives, risks and benefits explained, specific risks discussed. Consent was given by the patient. Immediately prior to procedure a time out was called to verify the correct patient, procedure, equipment, support staff and site/side marked as required. Patient was prepped and draped in the usual sterile fashion.     Plan: She will continue to work on strengthening knee.  She will follow-up with [place] in approximately 3 months for repeat injection at left knee.  Questions are encouraged and answered.

## 2017-04-16 IMAGING — DX DG KNEE 1-2V PORT*R*
2 series · 2 of 2 positions shown · non-contrast
Comparison: No priors.

CLINICAL DATA: 64-year-old female status post right total knee
replacement.

EXAM:
PORTABLE RIGHT KNEE - 1-2 VIEW

[knee ap]
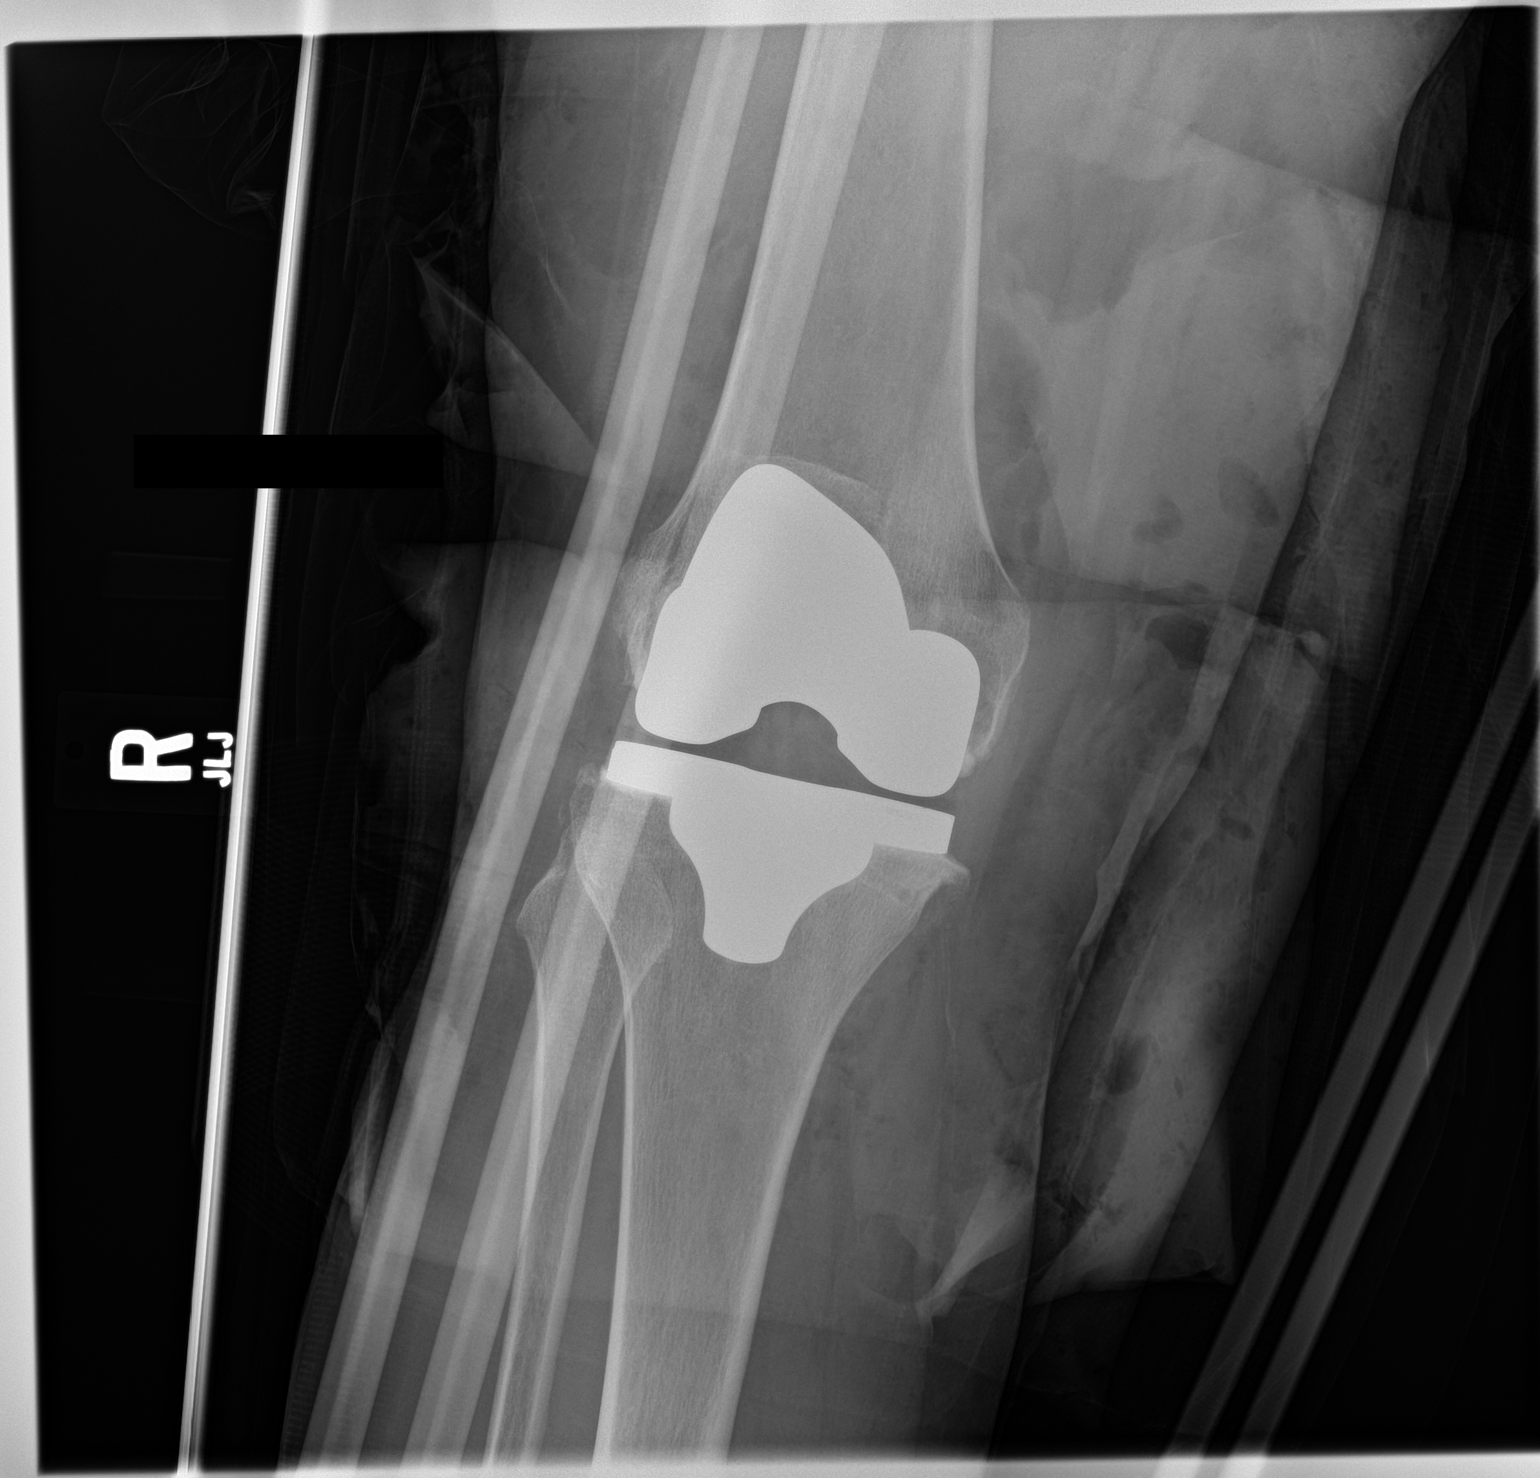

[knee lat]
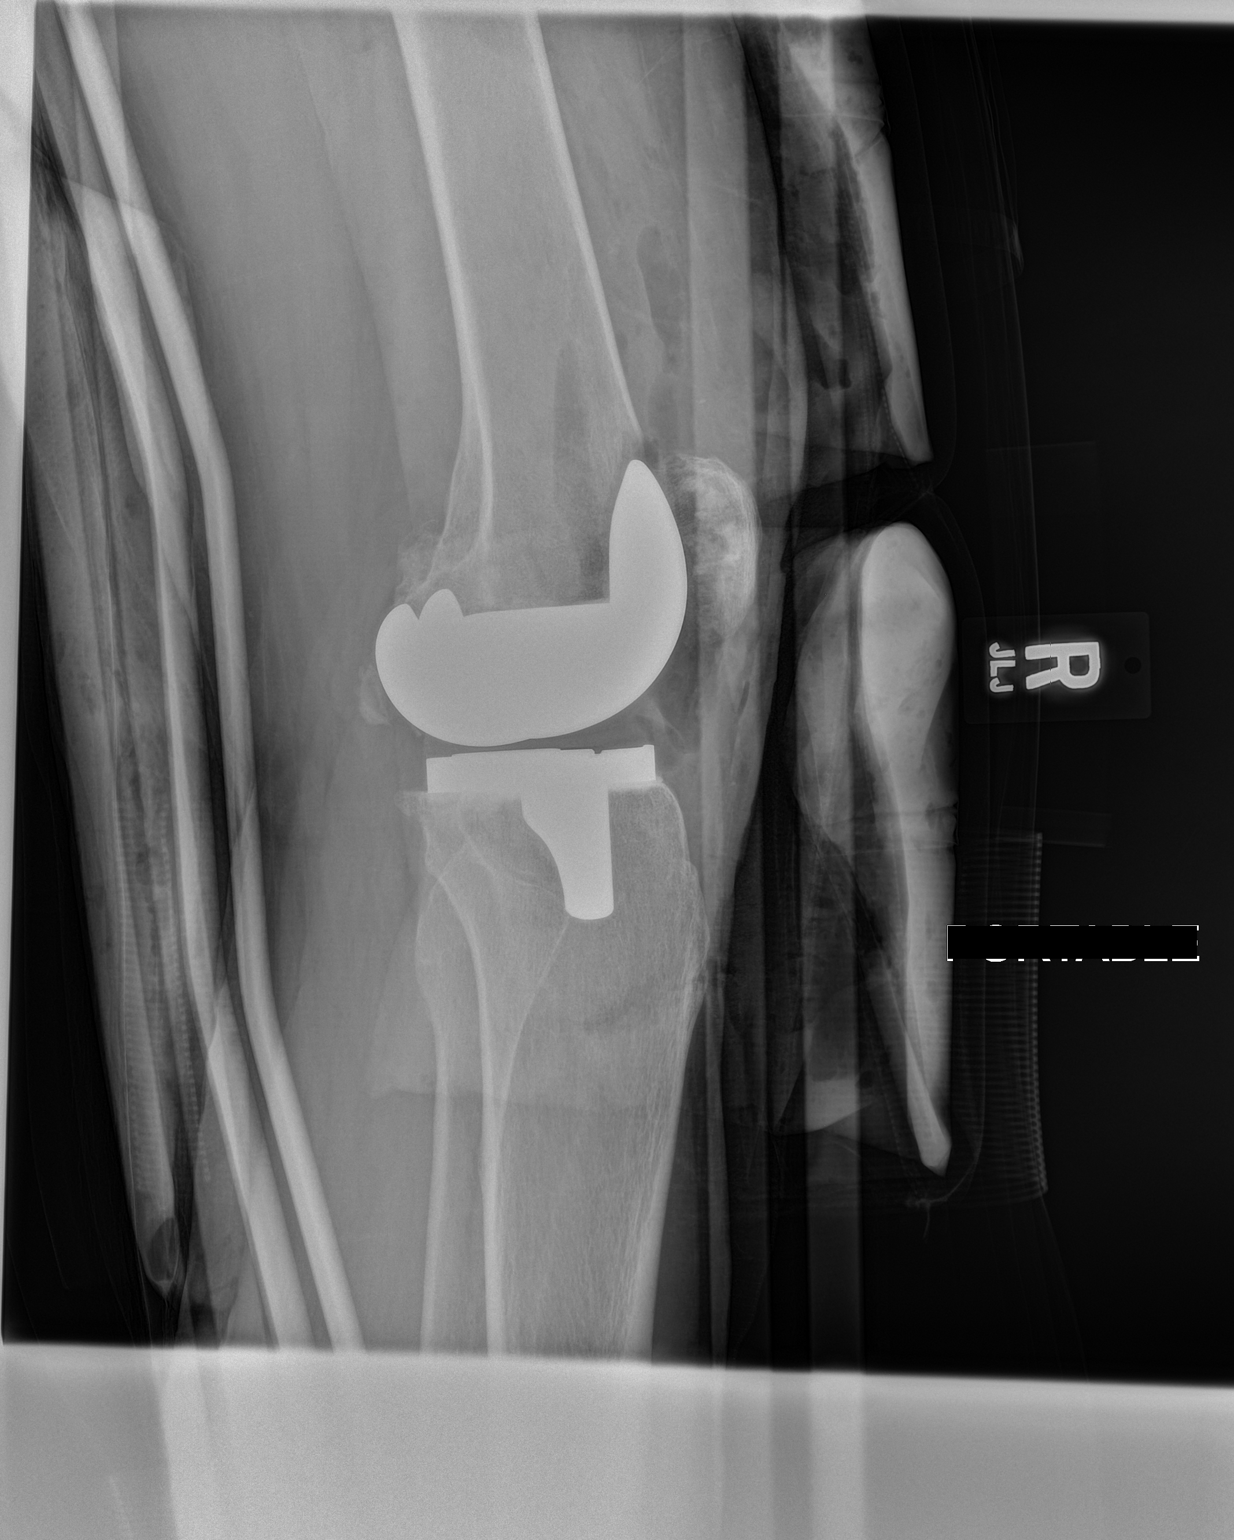

[2 of 2 positions shown; findings below may reference images not displayed]

FINDINGS: Postoperative changes of right total knee arthroplasty are noted.
The femoral and tibial components of the prosthesis appear well
seated without definite periprosthetic fracture or other immediate
complicating features. Alignment appears anatomic. Gas in the joint
space and the overlying soft tissues.
IMPRESSION: 1. Expected postoperative appearance following right total knee
arthroplasty.

## 2017-06-15 ENCOUNTER — Ambulatory Visit (INDEPENDENT_AMBULATORY_CARE_PROVIDER_SITE_OTHER): Payer: BC Managed Care – PPO | Admitting: Physician Assistant

## 2017-06-18 ENCOUNTER — Ambulatory Visit (INDEPENDENT_AMBULATORY_CARE_PROVIDER_SITE_OTHER): Payer: BC Managed Care – PPO | Admitting: Physician Assistant

## 2017-06-18 ENCOUNTER — Encounter (INDEPENDENT_AMBULATORY_CARE_PROVIDER_SITE_OTHER): Payer: Self-pay | Admitting: Physician Assistant

## 2017-06-18 DIAGNOSIS — M1712 Unilateral primary osteoarthritis, left knee: Secondary | ICD-10-CM

## 2017-06-18 MED ORDER — LIDOCAINE HCL 1 % IJ SOLN
3.0000 mL | INTRAMUSCULAR | Status: AC | PRN
  Administered 2017-06-18: 3 mL

## 2017-06-18 MED ORDER — METHYLPREDNISOLONE ACETATE 40 MG/ML IJ SUSP
40.0000 mg | INTRAMUSCULAR | Status: AC | PRN
  Administered 2017-06-18: 40 mg via INTRA_ARTICULAR

## 2017-06-18 NOTE — Progress Notes (Signed)
   Procedure Note  Patient: Lindsay Jimenez             Date of Birth: 05/31/1950           MRN: [id number]             Visit Date: 06/18/2017 HPI: This Aulds comes into the left knee pain.  She February 2019 was last seen was given cortisone injection time.  Last until 5 6 weeks ago.  She will have pain mostly medial aspect of the left knee. She has had acute injury to the knee.   Physical exam: Visit is good range of motion of the left knee no instability.  Tenderness along the medial joint line.  No effusion abnormal warmth erythema of either knee.  The right knee well-healed surgical incision and range of motion without pain. Procedures: Visit Diagnoses: Unilateral primary osteoarthritis, left knee  Large Joint Inj on 06/18/2017 1:11 PM Indications: pain Details: 22 G 1.5 in needle, anterolateral approach  Arthrogram: No  Medications: 3 mL lidocaine 1 %; 40 mg methylPREDNISolone acetate 40 MG/ML Outcome: tolerated well, no immediate complications Procedure, treatment alternatives, risks and benefits explained, specific risks discussed. Consent was given by the patient. Immediately prior to procedure a time out was called to verify the correct patient, procedure, equipment, support staff and site/side marked as required. Patient was prepped and draped in the usual sterile fashion.     Plan: Repeat steroid injection in the left  knee  In approximately 3 months. Quad strengthening.

## 2017-09-21 ENCOUNTER — Encounter (INDEPENDENT_AMBULATORY_CARE_PROVIDER_SITE_OTHER): Payer: Self-pay | Admitting: Physician Assistant

## 2017-09-21 ENCOUNTER — Ambulatory Visit (INDEPENDENT_AMBULATORY_CARE_PROVIDER_SITE_OTHER): Payer: BC Managed Care – PPO | Admitting: Physician Assistant

## 2017-09-21 DIAGNOSIS — M1712 Unilateral primary osteoarthritis, left knee: Secondary | ICD-10-CM | POA: Diagnosis not present

## 2017-09-21 MED ORDER — LIDOCAINE HCL 1 % IJ SOLN
3.0000 mL | INTRAMUSCULAR | Status: AC | PRN
  Administered 2017-09-21: 3 mL

## 2017-09-21 MED ORDER — METHYLPREDNISOLONE ACETATE 40 MG/ML IJ SUSP
40.0000 mg | INTRAMUSCULAR | Status: AC | PRN
  Administered 2017-09-21: 40 mg via INTRA_ARTICULAR

## 2017-09-21 NOTE — Progress Notes (Signed)
   Procedure Note  Patient: Lindsay Jimenez             Date of Birth: [birth year]           MRN: [id number]             Visit Date: 09/21/2017  HPI Lindsay Jimenez returns today follow-up of her left knee.  She states the last injection did help for several months time and is now requesting another injection.  She had no new injury.  Physical exam left knee: Good range of motion.  No abnormal warmth erythema or effusion.  Procedures: Visit Diagnoses: Unilateral primary osteoarthritis, left knee  Large Joint Inj on 09/21/2017 1:20 PM Indications: pain Details: 22 G 1.5 in needle, anterolateral approach  Arthrogram: No  Medications: 3 mL lidocaine 1 %; 40 mg methylPREDNISolone acetate 40 MG/ML Outcome: tolerated well, no immediate complications Procedure, treatment alternatives, risks and benefits explained, specific risks discussed. Consent was given by the patient. Immediately prior to procedure a time out was called to verify the correct patient, procedure, equipment, support staff and site/side marked as required. Patient was prepped and draped in the usual sterile fashion.     Plan: Follow-up as needed she understands that she can have cortisone injections no more often than every 3 months.

## 2017-12-22 ENCOUNTER — Ambulatory Visit (INDEPENDENT_AMBULATORY_CARE_PROVIDER_SITE_OTHER): Payer: Medicare Other | Admitting: Orthopaedic Surgery

## 2017-12-22 ENCOUNTER — Encounter (INDEPENDENT_AMBULATORY_CARE_PROVIDER_SITE_OTHER): Payer: Self-pay | Admitting: Orthopaedic Surgery

## 2017-12-22 DIAGNOSIS — M1712 Unilateral primary osteoarthritis, left knee: Secondary | ICD-10-CM

## 2017-12-22 DIAGNOSIS — M25562 Pain in left knee: Secondary | ICD-10-CM | POA: Diagnosis not present

## 2017-12-22 DIAGNOSIS — G8929 Other chronic pain: Secondary | ICD-10-CM | POA: Diagnosis not present

## 2017-12-22 MED ORDER — LIDOCAINE HCL 1 % IJ SOLN
3.0000 mL | INTRAMUSCULAR | Status: AC | PRN
  Administered 2017-12-22: 3 mL

## 2017-12-22 MED ORDER — METHYLPREDNISOLONE ACETATE 40 MG/ML IJ SUSP
40.0000 mg | INTRAMUSCULAR | Status: AC | PRN
  Administered 2017-12-22: 40 mg via INTRA_ARTICULAR

## 2017-12-22 NOTE — Progress Notes (Signed)
   Procedure Note  Patient: Lindsay Jimenez             Date of Birth: Dec 26, 1950           MRN: [id number]             Visit Date: 12/22/2017  Procedures: Visit Diagnoses: Unilateral primary osteoarthritis, left knee  Chronic pain of left knee  Large Joint Inj: L knee on 12/22/2017 10:37 AM Indications: diagnostic evaluation and pain Details: 22 G 1.5 in needle, superolateral approach  Arthrogram: No  Medications: 3 mL lidocaine 1 %; 40 mg methylPREDNISolone acetate 40 MG/ML Outcome: tolerated well, no immediate complications Procedure, treatment alternatives, risks and benefits explained, specific risks discussed. Consent was given by the patient. Immediately prior to procedure a time out was called to verify the correct patient, procedure, equipment, support staff and site/side marked as required. Patient was prepped and draped in the usual sterile fashion.     Patient is well-known to me.  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  She has a right total hip arthroplasty.  She has significant arthritis in her left knee.  So far injections are holding her and keeping her out from any type of knee replacement on the left side as of yet.  She comes in today hoping to have a steroid injection her left knee.  Is been over 3 months since her last injection.  On exam she has varus malalignment of her left knee with mild effusion with good range of motion x-rays in the past to show tricompartment arthritis of the left knee.  Patient tolerated steroid injection well today.  All question concerns were answered and addressed.  She will pursue a left knee replacement once he gets to the point where injections are not helping.

## 2018-08-02 ENCOUNTER — Ambulatory Visit (INDEPENDENT_AMBULATORY_CARE_PROVIDER_SITE_OTHER): Payer: Medicare Other | Admitting: Physician Assistant

## 2018-08-02 ENCOUNTER — Encounter: Payer: Self-pay | Admitting: Physician Assistant

## 2018-08-02 ENCOUNTER — Other Ambulatory Visit: Payer: Self-pay

## 2018-08-02 VITALS — Ht 68.0 in | Wt 190.0 lb

## 2018-08-02 DIAGNOSIS — M1712 Unilateral primary osteoarthritis, left knee: Secondary | ICD-10-CM

## 2018-08-02 MED ORDER — METHYLPREDNISOLONE ACETATE 40 MG/ML IJ SUSP
40.0000 mg | INTRAMUSCULAR | Status: AC | PRN
  Administered 2018-08-02: 40 mg via INTRA_ARTICULAR

## 2018-08-02 MED ORDER — LIDOCAINE HCL 1 % IJ SOLN
3.0000 mL | INTRAMUSCULAR | Status: AC | PRN
  Administered 2018-08-02: 3 mL

## 2018-08-02 MED ORDER — LIDOCAINE HCL 1 % IJ SOLN
0.5000 mL | INTRAMUSCULAR | Status: AC | PRN
  Administered 2018-08-02: .5 mL

## 2018-08-02 NOTE — Progress Notes (Signed)
   Procedure Note  Patient: Lindsay Jimenez             Date of Birth: 11/15/1950           MRN: [id number]             Visit Date: 08/02/2018  HPI: Ms. Barfuss comes in today to evaluate left knee pain.  States is been bothering her last couple weeks.  She is been taking ibuprofen without any real relief.  She has known osteoarthritis knee.  Note some swelling in the knee.  States last injection left knee on 12/22/2017 gave her good relief until recently.  Has had no new injury to the knee.  Physical exam: Left knee full extension full flexion.  Tenderness along medial joint line no abnormal warmth erythema or effusion.  No instability valgus varus stressing Procedures: Visit Diagnoses: Left knee osteoarthritis  Large Joint Inj: L knee on 08/02/2018 9:32 AM Indications: pain Details: 22 G 1.5 in needle, anterolateral approach  Arthrogram: No  Medications: 3 mL lidocaine 1 %; 40 mg methylPREDNISolone acetate 40 MG/ML; 0.5 mL lidocaine 1 % Outcome: tolerated well, no immediate complications Procedure, treatment alternatives, risks and benefits explained, specific risks discussed. Consent was given by the patient. Immediately prior to procedure a time out was called to verify the correct patient, procedure, equipment, support staff and site/side marked as required. Patient was prepped and draped in the usual sterile fashion.     Plan: She will follow-up with [place] as needed.  Work on Forensic scientist.  Questions encouraged and answered

## 2018-10-13 ENCOUNTER — Encounter: Payer: Self-pay | Admitting: Orthopaedic Surgery

## 2018-10-13 ENCOUNTER — Ambulatory Visit: Payer: Self-pay

## 2018-10-13 ENCOUNTER — Ambulatory Visit (INDEPENDENT_AMBULATORY_CARE_PROVIDER_SITE_OTHER): Payer: Medicare Other | Admitting: Orthopaedic Surgery

## 2018-10-13 DIAGNOSIS — M25562 Pain in left knee: Secondary | ICD-10-CM

## 2018-10-13 DIAGNOSIS — G8929 Other chronic pain: Secondary | ICD-10-CM | POA: Diagnosis not present

## 2018-10-13 DIAGNOSIS — M1712 Unilateral primary osteoarthritis, left knee: Secondary | ICD-10-CM

## 2018-10-13 NOTE — Progress Notes (Signed)
Office Visit Note   Patient: Lindsay Jimenez           Date of Birth: [birth year]           MRN: [id number] Visit Date: 10/13/2018              Requested by: No referring provider defined for this encounter. PCP: System, Pcp Not In   Assessment & Plan: Visit Diagnoses:  1. Chronic pain of left knee   2. Unilateral primary osteoarthritis, left knee     Plan:  Given the fact the patient is failed conservative treatment including time ,activity modification, cortisone and supplemental injections and still has severe pain that it affects her activities.  Therefore she would like to proceed with a left total knee arthroplasty.  She had a previous right total knee arthroplasty in 2017 and has done well.  She understands risk benefits surgery.  She understands postop protocol.  She will follow-up with [place] 2 weeks postop.  Questions encouraged and answered by Dr. Ninfa Linden consent.  Patient reports that last postop.  She was placed on Xarelto for DVT prophylaxis and this caused itching we changed her to 325 aspirin twice daily and she did well with this.  Follow-Up Instructions: Return 2 weeks postop.   Orders:  Orders Placed This Encounter  Procedures  . XR Knee 1-2 Views Left   No orders of the defined types were placed in this encounter.     Procedures: No procedures performed   Clinical Data: No additional findings.   Subjective: Chief Complaint  Patient presents with  . Left Knee - Pain    HPI Lindsay Jimenez returns today follow-up of left knee pain.  She states that the injection she was given back in June only gave her relief for about 3 weeks.  She has been playing a lot more golf this year and states that this is definitely aggravating her knee.  She was down at the beach earlier this summer and was unable to walk after being at the beach for just a couple of days and had to refrain from activities with her family.  States pain is constant mostly medial aspect of the knee.   She is tried supplemental injections and cortisone injections in the past.  Review of Systems Denies any fevers chills shortness of breath chest pain  Objective: Vital Signs: There were no vitals taken for this visit.  Physical Exam Constitutional:      Appearance: She is not ill-appearing or diaphoretic.  Pulmonary:     Effort: Pulmonary effort is normal.  Neurological:     Mental Status: She is alert and oriented to person, place, and time.     Ortho Exam Right knee excellent range of motion without pain.  Well-healed surgical incision.  Left knee full range of motion patellofemoral crepitus with passive range of motion.  No instability valgus varus stressing.  Tenderness along medial joint line.  No abnormal warmth erythema or effusion left knee.  Specialty Comments:  No specialty comments available.  Imaging: Xr Knee 1-2 Views Left  Result Date: 10/13/2018 Left knee: Tricompartmental changes with moderate/severe patellofemoral changes.  Near bone-on-bone medial compartment large osteophytes off the periarticular aspects of the lateral tibia.  No acute fractures.    PMFS History: Patient Active Problem List   Diagnosis Date Noted  . Unilateral primary osteoarthritis, left knee 08/21/2016  . Chronic pain of left knee 08/21/2016  . Osteoarthritis of right knee 03/02/2015  . Status post  total right knee replacement 03/02/2015  . Osteoarthritis of right hip 10/20/2014  . Status post total replacement of right hip 10/20/2014   Past Medical History:  Diagnosis Date  . Arthritis    oa  . Breast CA ([place]) 2002   bilateral  . Family history of adverse reaction to anesthesia    aunt had problem , not sure what problem  was  . GERD (gastroesophageal reflux disease)    occasional  . PONV (postoperative nausea and vomiting)     History reviewed. No pertinent family history.  Past Surgical History:  Procedure Laterality Date  . ablation to both knees  april and may 2016    to deaden nerves  . KNEE SURGERY Right 1970   cartlidge removed  . MASTECTOMY Bilateral 2002  . OOPHORECTOMY Bilateral 2004  . TOTAL HIP ARTHROPLASTY Right 10/20/2014   Procedure: RIGHT TOTAL HIP ARTHROPLASTY ANTERIOR APPROACH;  Surgeon: Mcarthur Rossetti, MD;  Location: [facility];  Service: Orthopedics;  Laterality: Right;  . TOTAL KNEE ARTHROPLASTY Right 03/02/2015   Procedure: RIGHT TOTAL KNEE ARTHROPLASTY;  Surgeon: Mcarthur Rossetti, MD;  Location: [facility];  Service: Orthopedics;  Laterality: Right;   Social History   Occupational History  . Not on file  Tobacco Use  . Smoking status: Never Smoker  . Smokeless tobacco: Never Used  Substance and Sexual Activity  . Alcohol use: No  . Drug use: No  . Sexual activity: Not on file

## 2018-11-04 ENCOUNTER — Other Ambulatory Visit: Payer: Self-pay

## 2018-11-09 ENCOUNTER — Other Ambulatory Visit: Payer: Self-pay | Admitting: Physician Assistant

## 2018-11-09 NOTE — Patient Instructions (Addendum)
DUE TO COVID-19 ONLY ONE VISITOR IS ALLOWED TO COME WITH YOU AND STAY IN THE WAITING ROOM ONLY DURING PRE OP AND PROCEDURE DAY OF SURGERY. THE 1 VISITOR MAY VISIT WITH YOU AFTER SURGERY IN YOUR PRIVATE ROOM DURING VISITING HOURS ONLY!  YOU NEED TO HAVE A COVID 19 TEST ON 11-16-18 @ 2:00 PM, THIS TEST MUST BE DONE BEFORE SURGERY, COME  [address], [place] , [address].  ([place]) ONCE YOUR COVID TEST IS COMPLETED, PLEASE BEGIN THE QUARANTINE INSTRUCTIONS AS OUTLINED IN YOUR HANDOUT.                Lindsay Jimenez  11/09/2018   Your procedure is scheduled on: 11-19-18   Report to [facility] Main  Entrance    Report to Admitting at 06:00 AM     Call this number if you have problems the morning of surgery [phone]    Remember: NO SOLID FOOD AFTER MIDNIGHT THE NIGHT PRIOR TO SURGERY. NOTHING BY MOUTH EXCEPT CLEAR LIQUIDS UNTIL 5:30 AM . PLEASE FINISH ENSURE DRINK PER SURGEON ORDER  WHICH NEEDS TO BE COMPLETED AT 5:30 AM.   CLEAR LIQUID DIET   Foods Allowed                                                                     Foods Excluded  Coffee and tea, regular and decaf                             liquids that you cannot  Plain Jell-O any favor except red or purple                                           see through such as: Fruit ices (not with fruit pulp)                                     milk, soups, orange juice  Iced Popsicles                                    All solid food Carbonated beverages, regular and diet                                    Cranberry, grape and apple juices Sports drinks like Gatorade Lightly seasoned clear broth or consume(fat free) Sugar, honey syrup   _____________________________________________________________________       Take these medicines the morning of surgery with A SIP OF WATER: Cetirizine (Zyrtec, Famotidine (Pepcid) and Nasal spray as needed.    BRUSH YOUR TEETH MORNING OF SURGERY AND  RINSE YOUR MOUTH OUT, NO CHEWING GUM CANDY OR MINTS.  You may not have any metal on your body including hair pins and              piercings     Do not wear jewelry, make-up, lotions, powders or perfumes, deodorant              Do not wear nail polish on your fingernails.  Do not shave  48 hours prior to surgery.              Do not bring valuables to the hospital. [place].  Contacts, dentures or bridgework may not be worn into surgery.  You may bring a small overnight bag     Special Instructions: N/A              Please read over the following fact sheets you were given: _____________________________________________________________________             [facility] - Preparing for Surgery Before surgery, you can play an important role.  Because skin is not sterile, your skin needs to be as free of germs as possible.  You can reduce the number of germs on your skin by washing with CHG (chlorahexidine gluconate) soap before surgery.  CHG is an antiseptic cleaner which kills germs and bonds with the skin to continue killing germs even after washing. Please DO NOT use if you have an allergy to CHG or antibacterial soaps.  If your skin becomes reddened/irritated stop using the CHG and inform your nurse when you arrive at Short Stay. Do not shave (including legs and underarms) for at least 48 hours prior to the first CHG shower.  You may shave your face/neck. Please follow these instructions carefully:  1.  Shower with CHG Soap the night before surgery and the  morning of Surgery.  2.  If you choose to wash your hair, wash your hair first as usual with your  normal  shampoo.  3.  After you shampoo, rinse your hair and body thoroughly to remove the  shampoo.                           4.  Use CHG as you would any other liquid soap.  You can apply chg directly  to the skin and wash                       Gently  with a scrungie or clean washcloth.  5.  Apply the CHG Soap to your body ONLY FROM THE NECK DOWN.   Do not use on face/ open                           Wound or open sores. Avoid contact with eyes, ears mouth and genitals (private parts).                       Wash face,  Genitals (private parts) with your normal soap.             6.  Wash thoroughly, paying special attention to the area where your surgery  will be performed.  7.  Thoroughly rinse your body with warm water from the neck down.  8.  DO NOT shower/wash with your normal soap after using and rinsing off  the  CHG Soap.                9.  Pat yourself dry with a clean towel.            10.  Wear clean pajamas.            11.  Place clean sheets on your bed the night of your first shower and do not  sleep with pets. Day of Surgery : Do not apply any lotions/deodorants the morning of surgery.  Please wear clean clothes to the hospital/surgery center.  FAILURE TO FOLLOW THESE INSTRUCTIONS MAY RESULT IN THE CANCELLATION OF YOUR SURGERY PATIENT SIGNATURE_________________________________  NURSE SIGNATURE__________________________________  ________________________________________________________________________

## 2018-11-09 NOTE — Progress Notes (Signed)
Please place surgery orders. Pt scheduled for PAT appt on 11-09-18.

## 2018-11-10 ENCOUNTER — Encounter (HOSPITAL_COMMUNITY): Payer: Self-pay

## 2018-11-10 ENCOUNTER — Encounter (HOSPITAL_COMMUNITY)
Admission: RE | Admit: 2018-11-10 | Discharge: 2018-11-10 | Disposition: A | Discharge status: Home / Self Care | Payer: Medicare Other | Source: Ambulatory Visit | Attending: Orthopaedic Surgery | Admitting: Orthopaedic Surgery

## 2018-11-10 ENCOUNTER — Other Ambulatory Visit: Payer: Self-pay

## 2018-11-10 DIAGNOSIS — M1712 Unilateral primary osteoarthritis, left knee: Secondary | ICD-10-CM | POA: Diagnosis not present

## 2018-11-10 DIAGNOSIS — Z01812 Encounter for preprocedural laboratory examination: Secondary | ICD-10-CM | POA: Diagnosis present

## 2018-11-10 LAB — BASIC METABOLIC PANEL
Anion gap: 8 (ref 5–15)
BUN: 21 mg/dL (ref 8–23)
CO2: 27 mmol/L (ref 22–32)
Calcium: 9.7 mg/dL (ref 8.9–10.3)
Chloride: 103 mmol/L (ref 98–111)
Creatinine, Ser: 0.85 mg/dL (ref 0.44–1.00)
GFR calc Af Amer: >60 mL/min (ref >60)
GFR calc non Af Amer: >60 mL/min (ref >60)
Glucose, Bld: 107 mg/dL — ABNORMAL HIGH (ref 70–99)
Potassium: 5 mmol/L (ref 3.5–5.1)
Sodium: 138 mmol/L (ref 135–145)

## 2018-11-10 LAB — CBC
HCT: 43.1 % (ref 36.0–46.0)
Hemoglobin: 14.1 g/dL (ref 12.0–15.0)
MCH: 30 pg (ref 26.0–34.0)
MCHC: 32.7 g/dL (ref 30.0–36.0)
MCV: 91.7 fL (ref 80.0–100.0)
Platelets: 233 10*3/uL (ref 150–400)
RBC: 4.7 MIL/uL (ref 3.87–5.11)
RDW: 12.1 % (ref 11.5–15.5)
WBC: 6.3 10*3/uL (ref 4.0–10.5)
nRBC: 0 % (ref 0.0–0.2)

## 2018-11-10 LAB — SURGICAL PCR SCREEN
MRSA, PCR: NEGATIVE
Staphylococcus aureus: NEGATIVE

## 2018-11-16 ENCOUNTER — Other Ambulatory Visit (HOSPITAL_COMMUNITY)
Admission: RE | Admit: 2018-11-16 | Discharge: 2018-11-16 | Disposition: A | Discharge status: Home / Self Care | Payer: Medicare Other | Source: Ambulatory Visit | Attending: Orthopaedic Surgery | Admitting: Orthopaedic Surgery

## 2018-11-16 DIAGNOSIS — Z01812 Encounter for preprocedural laboratory examination: Secondary | ICD-10-CM | POA: Insufficient documentation

## 2018-11-16 DIAGNOSIS — Z20828 Contact with and (suspected) exposure to other viral communicable diseases: Secondary | ICD-10-CM | POA: Diagnosis not present

## 2018-11-17 LAB — NOVEL CORONAVIRUS, NAA (HOSP ORDER, SEND-OUT TO REF LAB; TAT 18-24 HRS): SARS-CoV-2, NAA: NOT DETECTED

## 2018-11-19 ENCOUNTER — Encounter (HOSPITAL_COMMUNITY): Payer: Self-pay | Admitting: *Deleted

## 2018-11-19 ENCOUNTER — Ambulatory Visit (HOSPITAL_COMMUNITY): Payer: Medicare Other | Admitting: Anesthesiology

## 2018-11-19 ENCOUNTER — Encounter (HOSPITAL_COMMUNITY)
Admission: RE | Disposition: A | Discharge status: Home / Self Care | Payer: Self-pay | Source: Home / Self Care | Attending: Orthopaedic Surgery

## 2018-11-19 ENCOUNTER — Observation Stay (HOSPITAL_COMMUNITY)
Admission: RE | Admit: 2018-11-19 | Discharge: 2018-11-20 | Disposition: A | Discharge status: Home / Self Care | Payer: Medicare Other | Attending: Orthopaedic Surgery | Admitting: Orthopaedic Surgery

## 2018-11-19 ENCOUNTER — Other Ambulatory Visit: Payer: Self-pay

## 2018-11-19 ENCOUNTER — Ambulatory Visit (HOSPITAL_COMMUNITY): Payer: Medicare Other | Admitting: Physician Assistant

## 2018-11-19 ENCOUNTER — Observation Stay (HOSPITAL_COMMUNITY): Payer: Medicare Other

## 2018-11-19 DIAGNOSIS — Z885 Allergy status to narcotic agent status: Secondary | ICD-10-CM | POA: Diagnosis not present

## 2018-11-19 DIAGNOSIS — Z96651 Presence of right artificial knee joint: Secondary | ICD-10-CM | POA: Diagnosis not present

## 2018-11-19 DIAGNOSIS — M1611 Unilateral primary osteoarthritis, right hip: Secondary | ICD-10-CM | POA: Insufficient documentation

## 2018-11-19 DIAGNOSIS — Z96652 Presence of left artificial knee joint: Secondary | ICD-10-CM

## 2018-11-19 DIAGNOSIS — M170 Bilateral primary osteoarthritis of knee: Principal | ICD-10-CM | POA: Insufficient documentation

## 2018-11-19 DIAGNOSIS — K219 Gastro-esophageal reflux disease without esophagitis: Secondary | ICD-10-CM | POA: Diagnosis not present

## 2018-11-19 DIAGNOSIS — Z888 Allergy status to other drugs, medicaments and biological substances status: Secondary | ICD-10-CM | POA: Insufficient documentation

## 2018-11-19 DIAGNOSIS — M1712 Unilateral primary osteoarthritis, left knee: Secondary | ICD-10-CM | POA: Diagnosis not present

## 2018-11-19 DIAGNOSIS — Z96641 Presence of right artificial hip joint: Secondary | ICD-10-CM | POA: Diagnosis not present

## 2018-11-19 DIAGNOSIS — M25762 Osteophyte, left knee: Secondary | ICD-10-CM | POA: Insufficient documentation

## 2018-11-19 HISTORY — PX: TOTAL KNEE ARTHROPLASTY: SHX125

## 2018-11-19 SURGERY — ARTHROPLASTY, KNEE, TOTAL
Anesthesia: Spinal | Site: Knee | Laterality: Left

## 2018-11-19 MED ORDER — LIDOCAINE 2% (20 MG/ML) 5 ML SYRINGE
INTRAMUSCULAR | Status: AC
  Filled 2018-11-19: qty 5

## 2018-11-19 MED ORDER — ACETAMINOPHEN 325 MG PO TABS: 325.0000 mg | ORAL_TABLET | Freq: Once | ORAL | Status: DC | PRN

## 2018-11-19 MED ORDER — CEFAZOLIN SODIUM-DEXTROSE 2-4 GM/100ML-% IV SOLN
2.0000 g | INTRAVENOUS | Status: AC
  Administered 2018-11-19: 2 g via INTRAVENOUS
  Filled 2018-11-19: qty 100

## 2018-11-19 MED ORDER — MIDAZOLAM HCL 5 MG/5ML IJ SOLN
INTRAMUSCULAR | Status: DC | PRN
  Administered 2018-11-19: 2 mg via INTRAVENOUS

## 2018-11-19 MED ORDER — ONDANSETRON HCL 4 MG/2ML IJ SOLN
INTRAMUSCULAR | Status: DC | PRN
  Administered 2018-11-19: 4 mg via INTRAVENOUS

## 2018-11-19 MED ORDER — METOCLOPRAMIDE HCL 5 MG PO TABS: 5.0000 mg | ORAL_TABLET | Freq: Three times a day (TID) | ORAL | Status: DC | PRN

## 2018-11-19 MED ORDER — SODIUM CHLORIDE 0.9 % IR SOLN
Status: DC | PRN
  Administered 2018-11-19: 1000 mL

## 2018-11-19 MED ORDER — CHLORHEXIDINE GLUCONATE 4 % EX LIQD: 60.0000 mL | Freq: Once | CUTANEOUS | Status: DC

## 2018-11-19 MED ORDER — CEFAZOLIN SODIUM-DEXTROSE 1-4 GM/50ML-% IV SOLN
1.0000 g | Freq: Four times a day (QID) | INTRAVENOUS | Status: AC
  Administered 2018-11-19 (×2): 1 g via INTRAVENOUS
  Filled 2018-11-19 (×2): qty 50

## 2018-11-19 MED ORDER — PANTOPRAZOLE SODIUM 40 MG PO TBEC
40.0000 mg | DELAYED_RELEASE_TABLET | Freq: Every day | ORAL | Status: DC
  Administered 2018-11-19 – 2018-11-20 (×2): 40 mg via ORAL
  Filled 2018-11-19 (×2): qty 1

## 2018-11-19 MED ORDER — POLYETHYLENE GLYCOL 3350 17 G PO PACK: 17.0000 g | PACK | Freq: Every day | ORAL | Status: DC | PRN

## 2018-11-19 MED ORDER — ASPIRIN EC 81 MG PO TBEC
81.0000 mg | DELAYED_RELEASE_TABLET | Freq: Two times a day (BID) | ORAL | Status: DC
  Administered 2018-11-19 – 2018-11-20 (×2): 81 mg via ORAL
  Filled 2018-11-19 (×2): qty 1

## 2018-11-19 MED ORDER — MIDAZOLAM HCL 2 MG/2ML IJ SOLN
1.0000 mg | INTRAMUSCULAR | Status: DC
  Administered 2018-11-19: 2 mg via INTRAVENOUS

## 2018-11-19 MED ORDER — PROMETHAZINE HCL 25 MG/ML IJ SOLN: 6.2500 mg | INTRAMUSCULAR | Status: DC | PRN

## 2018-11-19 MED ORDER — ONDANSETRON HCL 4 MG PO TABS: 4.0000 mg | ORAL_TABLET | Freq: Four times a day (QID) | ORAL | Status: DC | PRN

## 2018-11-19 MED ORDER — METOCLOPRAMIDE HCL 5 MG/ML IJ SOLN: 5.0000 mg | Freq: Three times a day (TID) | INTRAMUSCULAR | Status: DC | PRN

## 2018-11-19 MED ORDER — ASPIRIN EC 81 MG PO TBEC: 81.0000 mg | DELAYED_RELEASE_TABLET | Freq: Two times a day (BID) | ORAL | Status: DC

## 2018-11-19 MED ORDER — ACETAMINOPHEN 325 MG PO TABS
325.0000 mg | ORAL_TABLET | Freq: Four times a day (QID) | ORAL | Status: DC | PRN
  Administered 2018-11-20: 650 mg via ORAL
  Filled 2018-11-19: qty 2

## 2018-11-19 MED ORDER — DEXAMETHASONE SODIUM PHOSPHATE 10 MG/ML IJ SOLN
INTRAMUSCULAR | Status: DC | PRN
  Administered 2018-11-19: 10 mg via INTRAVENOUS

## 2018-11-19 MED ORDER — OXYCODONE HCL 5 MG PO TABS: 10.0000 mg | ORAL_TABLET | ORAL | Status: DC | PRN

## 2018-11-19 MED ORDER — LACTATED RINGERS IV SOLN: INTRAVENOUS | Status: DC

## 2018-11-19 MED ORDER — SODIUM CHLORIDE 0.9 % IV SOLN
INTRAVENOUS | Status: DC
  Administered 2018-11-19 – 2018-11-20 (×2): via INTRAVENOUS

## 2018-11-19 MED ORDER — MIDAZOLAM HCL 2 MG/2ML IJ SOLN
INTRAMUSCULAR | Status: AC
  Filled 2018-11-19: qty 2

## 2018-11-19 MED ORDER — PROPOFOL 500 MG/50ML IV EMUL
INTRAVENOUS | Status: DC | PRN
  Administered 2018-11-19: 75 ug/kg/min via INTRAVENOUS

## 2018-11-19 MED ORDER — METHOCARBAMOL 500 MG PO TABS
500.0000 mg | ORAL_TABLET | Freq: Four times a day (QID) | ORAL | Status: DC | PRN
  Administered 2018-11-19 – 2018-11-20 (×4): 500 mg via ORAL
  Filled 2018-11-19 (×4): qty 1

## 2018-11-19 MED ORDER — DEXAMETHASONE SODIUM PHOSPHATE 10 MG/ML IJ SOLN
INTRAMUSCULAR | Status: AC
  Filled 2018-11-19: qty 1

## 2018-11-19 MED ORDER — POVIDONE-IODINE 10 % EX SWAB: 2.0000 "application " | Freq: Once | CUTANEOUS | Status: DC

## 2018-11-19 MED ORDER — PHENYLEPHRINE 40 MCG/ML (10ML) SYRINGE FOR IV PUSH (FOR BLOOD PRESSURE SUPPORT)
PREFILLED_SYRINGE | INTRAVENOUS | Status: AC
  Filled 2018-11-19: qty 10

## 2018-11-19 MED ORDER — ROPIVACAINE HCL 7.5 MG/ML IJ SOLN
INTRAMUSCULAR | Status: DC | PRN
  Administered 2018-11-19: 20 mL via PERINEURAL

## 2018-11-19 MED ORDER — DIPHENHYDRAMINE HCL 12.5 MG/5ML PO ELIX: 12.5000 mg | ORAL_SOLUTION | ORAL | Status: DC | PRN

## 2018-11-19 MED ORDER — FENTANYL CITRATE (PF) 100 MCG/2ML IJ SOLN
INTRAMUSCULAR | Status: AC
  Filled 2018-11-19: qty 2

## 2018-11-19 MED ORDER — HYDROMORPHONE HCL 1 MG/ML IJ SOLN: 0.2500 mg | INTRAMUSCULAR | Status: DC | PRN

## 2018-11-19 MED ORDER — SODIUM CHLORIDE 0.9 % IV SOLN
INTRAVENOUS | Status: DC | PRN
  Administered 2018-11-19: 50 ug/min via INTRAVENOUS

## 2018-11-19 MED ORDER — KETOROLAC TROMETHAMINE 15 MG/ML IJ SOLN
7.5000 mg | Freq: Four times a day (QID) | INTRAMUSCULAR | Status: AC
  Administered 2018-11-19 – 2018-11-20 (×3): 7.5 mg via INTRAVENOUS
  Filled 2018-11-19 (×3): qty 1

## 2018-11-19 MED ORDER — BUPIVACAINE HCL (PF) 0.25 % IJ SOLN
INTRAMUSCULAR | Status: AC
  Filled 2018-11-19: qty 30

## 2018-11-19 MED ORDER — PHENYLEPHRINE 40 MCG/ML (10ML) SYRINGE FOR IV PUSH (FOR BLOOD PRESSURE SUPPORT)
PREFILLED_SYRINGE | INTRAVENOUS | Status: DC | PRN
  Administered 2018-11-19 (×2): 80 ug via INTRAVENOUS

## 2018-11-19 MED ORDER — METHOCARBAMOL 500 MG IVPB - SIMPLE MED
INTRAVENOUS | Status: AC
  Administered 2018-11-19: 500 mg via INTRAVENOUS
  Filled 2018-11-19: qty 50

## 2018-11-19 MED ORDER — BISACODYL 10 MG RE SUPP: 10.0000 mg | Freq: Every day | RECTAL | Status: DC | PRN

## 2018-11-19 MED ORDER — METHOCARBAMOL 500 MG IVPB - SIMPLE MED
500.0000 mg | Freq: Four times a day (QID) | INTRAVENOUS | Status: DC | PRN
  Administered 2018-11-19: 12:00:00 500 mg via INTRAVENOUS
  Filled 2018-11-19: qty 50

## 2018-11-19 MED ORDER — ZOLPIDEM TARTRATE 5 MG PO TABS: 5.0000 mg | ORAL_TABLET | Freq: Every evening | ORAL | Status: DC | PRN

## 2018-11-19 MED ORDER — FENTANYL CITRATE (PF) 100 MCG/2ML IJ SOLN
50.0000 ug | INTRAMUSCULAR | Status: DC
  Administered 2018-11-19: 50 ug via INTRAVENOUS

## 2018-11-19 MED ORDER — ALUM & MAG HYDROXIDE-SIMETH 200-200-20 MG/5ML PO SUSP: 30.0000 mL | ORAL | Status: DC | PRN

## 2018-11-19 MED ORDER — PROPOFOL 10 MG/ML IV BOLUS
INTRAVENOUS | Status: AC
  Filled 2018-11-19: qty 60

## 2018-11-19 MED ORDER — ACETAMINOPHEN 160 MG/5ML PO SOLN: 325.0000 mg | Freq: Once | ORAL | Status: DC | PRN

## 2018-11-19 MED ORDER — ONDANSETRON HCL 4 MG/2ML IJ SOLN: 4.0000 mg | Freq: Four times a day (QID) | INTRAMUSCULAR | Status: DC | PRN

## 2018-11-19 MED ORDER — ONDANSETRON HCL 4 MG/2ML IJ SOLN
INTRAMUSCULAR | Status: AC
  Filled 2018-11-19: qty 2

## 2018-11-19 MED ORDER — HYDROMORPHONE HCL 1 MG/ML IJ SOLN: 0.5000 mg | INTRAMUSCULAR | Status: DC | PRN

## 2018-11-19 MED ORDER — TRANEXAMIC ACID-NACL 1000-0.7 MG/100ML-% IV SOLN
1000.0000 mg | INTRAVENOUS | Status: AC
  Administered 2018-11-19: 1000 mg via INTRAVENOUS
  Filled 2018-11-19: qty 100

## 2018-11-19 MED ORDER — PHENYLEPHRINE HCL (PRESSORS) 10 MG/ML IV SOLN
INTRAVENOUS | Status: AC
  Filled 2018-11-19: qty 1

## 2018-11-19 MED ORDER — LACTATED RINGERS IV SOLN
INTRAVENOUS | Status: DC
  Administered 2018-11-19 (×2): via INTRAVENOUS

## 2018-11-19 MED ORDER — PROPOFOL 10 MG/ML IV BOLUS
INTRAVENOUS | Status: DC | PRN
  Administered 2018-11-19 (×2): 20 mg via INTRAVENOUS

## 2018-11-19 MED ORDER — MEPERIDINE HCL 50 MG/ML IJ SOLN: 6.2500 mg | INTRAMUSCULAR | Status: DC | PRN

## 2018-11-19 MED ORDER — ACETAMINOPHEN 10 MG/ML IV SOLN: 1000.0000 mg | Freq: Once | INTRAVENOUS | Status: DC | PRN

## 2018-11-19 MED ORDER — PROPOFOL 10 MG/ML IV BOLUS
INTRAVENOUS | Status: AC
  Filled 2018-11-19: qty 20

## 2018-11-19 MED ORDER — OXYCODONE HCL 5 MG PO TABS
5.0000 mg | ORAL_TABLET | ORAL | Status: DC | PRN
  Administered 2018-11-19 – 2018-11-20 (×5): 5 mg via ORAL
  Filled 2018-11-19 (×2): qty 1
  Filled 2018-11-19: qty 2
  Filled 2018-11-19 (×2): qty 1

## 2018-11-19 SURGICAL SUPPLY — 58 items
APL SKNCLS STERI-STRIP NONHPOA (GAUZE/BANDAGES/DRESSINGS) ×1
BAG SPEC THK2 15X12 ZIP CLS (MISCELLANEOUS)
BAG ZIPLOCK 12X15 (MISCELLANEOUS) IMPLANT
BASEPLATE TIBIAL TRIATHALON 3 (Plate) ×1 IMPLANT
BEARIN INSERT TIBIAL SZ 3 11 (Insert) ×2 IMPLANT
BEARING INSERT TIBIAL SZ 3 11 (Insert) IMPLANT
BENZOIN TINCTURE PRP APPL 2/3 (GAUZE/BANDAGES/DRESSINGS) ×2 IMPLANT
BLADE SAG 18X100X1.27 (BLADE) IMPLANT
BLADE SURG SZ10 CARB STEEL (BLADE) ×4 IMPLANT
BNDG ELASTIC 6X5.8 VLCR STR LF (GAUZE/BANDAGES/DRESSINGS) ×4 IMPLANT
BOWL SMART MIX CTS (DISPOSABLE) IMPLANT
BSPLAT TIB 3 CMNT PRM STRL KN (Plate) ×1 IMPLANT
CEMENT BONE SIMPLEX SPEEDSET (Cement) ×4 IMPLANT
COVER SURGICAL LIGHT HANDLE (MISCELLANEOUS) ×2 IMPLANT
COVER WAND RF STERILE (DRAPES) IMPLANT
CUFF TOURN SGL QUICK 34 (TOURNIQUET CUFF) ×2
CUFF TRNQT CYL 34X4.125X (TOURNIQUET CUFF) ×1 IMPLANT
DECANTER SPIKE VIAL GLASS SM (MISCELLANEOUS) IMPLANT
DRAPE U-SHAPE 47X51 STRL (DRAPES) ×2 IMPLANT
DRSG PAD ABDOMINAL 8X10 ST (GAUZE/BANDAGES/DRESSINGS) ×4 IMPLANT
DURAPREP 26ML APPLICATOR (WOUND CARE) ×2 IMPLANT
ELECT REM PT RETURN 15FT ADLT (MISCELLANEOUS) ×2 IMPLANT
FEMORAL PEG DISTAL FIXATION (Orthopedic Implant) ×1 IMPLANT
FEMORAL TRIATH POST STAB  SZ3 (Orthopedic Implant) ×1 IMPLANT
FEMORAL TRIATH POST STAB SZ3 (Orthopedic Implant) IMPLANT
GAUZE SPONGE 4X4 12PLY STRL (GAUZE/BANDAGES/DRESSINGS) ×2 IMPLANT
GAUZE XEROFORM 1X8 LF (GAUZE/BANDAGES/DRESSINGS) IMPLANT
GLOVE BIO SURGEON STRL SZ7.5 (GLOVE) ×2 IMPLANT
GLOVE BIOGEL PI IND STRL 8 (GLOVE) ×2 IMPLANT
GLOVE BIOGEL PI INDICATOR 8 (GLOVE) ×2
GLOVE ECLIPSE 8.0 STRL XLNG CF (GLOVE) ×2 IMPLANT
GOWN STRL REUS W/TWL XL LVL3 (GOWN DISPOSABLE) ×4 IMPLANT
HANDPIECE INTERPULSE COAX TIP (DISPOSABLE) ×2
HOLDER FOLEY CATH W/STRAP (MISCELLANEOUS) ×1 IMPLANT
IMMOBILIZER KNEE 20 (SOFTGOODS) ×2
IMMOBILIZER KNEE 20 THIGH 36 (SOFTGOODS) ×1 IMPLANT
KIT TURNOVER KIT A (KITS) IMPLANT
NS IRRIG 1000ML POUR BTL (IV SOLUTION) ×2 IMPLANT
PACK TOTAL KNEE CUSTOM (KITS) ×2 IMPLANT
PADDING CAST COTTON 6X4 STRL (CAST SUPPLIES) ×4 IMPLANT
PATELLA TRIATHLON SZ 29 9 MM (Orthopedic Implant) ×1 IMPLANT
PIN FLUTED HEDLESS FIX 3.5X1/8 (PIN) ×1 IMPLANT
PROTECTOR NERVE ULNAR (MISCELLANEOUS) ×2 IMPLANT
SET HNDPC FAN SPRY TIP SCT (DISPOSABLE) ×1 IMPLANT
SET PAD KNEE POSITIONER (MISCELLANEOUS) ×2 IMPLANT
STAPLER VISISTAT 35W (STAPLE) IMPLANT
STRIP CLOSURE SKIN 1/2X4 (GAUZE/BANDAGES/DRESSINGS) ×4 IMPLANT
SUT MNCRL AB 4-0 PS2 18 (SUTURE) ×1 IMPLANT
SUT VIC AB 0 CT1 27 (SUTURE) ×2
SUT VIC AB 0 CT1 27XBRD ANTBC (SUTURE) ×1 IMPLANT
SUT VIC AB 1 CT1 36 (SUTURE) ×4 IMPLANT
SUT VIC AB 2-0 CT1 27 (SUTURE) ×4
SUT VIC AB 2-0 CT1 TAPERPNT 27 (SUTURE) ×2 IMPLANT
TRAY FOLEY MTR SLVR 14FR STAT (SET/KITS/TRAYS/PACK) ×1 IMPLANT
TRAY FOLEY MTR SLVR 16FR STAT (SET/KITS/TRAYS/PACK) IMPLANT
WATER STERILE IRR 1000ML POUR (IV SOLUTION) ×3 IMPLANT
WRAP KNEE MAXI GEL POST OP (GAUZE/BANDAGES/DRESSINGS) ×2 IMPLANT
YANKAUER SUCT BULB TIP 10FT TU (MISCELLANEOUS) ×2 IMPLANT

## 2018-11-19 NOTE — TOC Progression Note (Signed)
Transition of Care [facility]) - Progression Note    Patient Details  Name: Lindsay Jimenez MRN: [id number] Date of Birth: [birth year]  [facility] CM/SW Contact  Leeroy Cha, RN Phone Number: 11/19/2018, 2:43 PM  Clinical Narrative:    To dcd to home with hhc [facility] for pt   Expected Discharge Plan: [place] Barriers to Discharge: No Barriers Identified  Expected Discharge Plan and Services Expected Discharge Plan: [place]   Discharge Planning Services: CM Consult Post Acute Care Choice: [place] arrangements for the past 2 months: Single Family Home                           HH Arranged: PT [place]: [facility] [employer] Date [place]: 11/19/18 Time [place]: [place] Representative spoke with at [place]: mike   Social Determinants of Health ([place]) Interventions    Readmission Risk Interventions No flowsheet data found.

## 2018-11-19 NOTE — Op Note (Signed)
NAME: Lindsay Jimenez, DESA MEDICAL RECORD [id number] ACCOUNT [id number] DATE OF BIRTH:February 13, 1950 FACILITY: [facility] LOCATION: [facility] PHYSICIAN:Brain Honeycutt [place], MD  OPERATIVE REPORT  DATE OF PROCEDURE:  11/19/2018  PREOPERATIVE DIAGNOSIS:  Primary osteoarthritis and degenerative joint disease, left knee.  POSTOPERATIVE DIAGNOSIS:  Primary osteoarthritis and degenerative joint disease, left knee.  PROCEDURE:  Left total knee arthroplasty.  IMPLANTS:  Stryker Triathlon cemented knee system with size 3 femur, size 3 tibial tray, 11 mm fixed-bearing polyethylene insert, size 29 patellar button.  SURGEON:  Lind Guest. Ninfa Linden, MD  ASSISTANT:  Janine Ores, PA-C  ANESTHESIA: 1.  Left lower extremity adductor canal block. 2.  Spinal.  TOURNIQUET TIME:  Under 1 hour.  ESTIMATED BLOOD LOSS:  About 100 mL.  ANTIBIOTICS:  Two grams IV Ancef.  COMPLICATIONS:  None.  INDICATIONS:  The patient is a very pleasant 68 year old female well known to me.  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  Her left knee does show varus malalignment and complete loss of medial  joint space as well as periarticular osteophytes in all 3 compartments.  We have tried steroid injections in her left knee as well as hyaluronic acid injections.  She has worked on activity modification, strengthening, and her left knee has been  worsening over 35 years now.  At this point given the success of her right total knee arthroplasty we did 3 years ago, she wished to proceed at this point with a left total knee arthroplasty.  Having had this before, she is fully aware of the risk of  acute blood loss anemia, nerve and vessel injury, fracture, infection, DVT, and implant failure.  She understands our goals are to decrease pain, improve mobility, and overall improve quality of life.  DESCRIPTION OF PROCEDURE:  After informed consent was obtained and the left  knee was marked, anesthesia obtained a regional anesthesia with an adductor canal block in the holding room.  She was then brought to the operating room and sat up on the  operating table.  Spinal anesthesia was obtained.  She was then laid in supine position.  We had a Foley catheter placed, and a nonsterile tourniquet was placed around the upper left thigh.  Left thigh, knee, leg and ankle were prepped and draped with  DuraPrep and sterile drapes including a sterile stockinette.  Timeout was called, and she was identified as patient and correct left knee.  We then used an Esmarch to wrap that leg, and tourniquet was inflated to 250 mm of pressure.  We then made a  midline incision over the patella and carried this proximally and distally.  We dissected down to the knee joint and carried out a medial parapatellar arthrotomy, finding a moderate joint effusion.  With the knee in a flexed position, we found that she  had complete loss of cartilage on the medial aspect of her knee.  We removed the ACL, PCL, medial and lateral meniscus and osteophytes around the knee.  With the knee in a flexed position, we used an extramedullary cutting guide for making our proximal  tibia cut.  We set this for a zero slope, correcting for varus and valgus, and set this for taking 9 mm off the high side.  We made this cut without difficulty.  We then went to the intercondylar area of the knee to place an intramedullary hole for an  alignment guide  intramedullary for a distal femoral cutting block and  set this for 5 degrees externally rotated and a 10 mm distal femoral cut.  We made that cut without difficulty and brought the knee back down to full extension.  With a 9 mm extension  block, achieved full extension.  We removed more remnants of meniscus in the back of the knee as well.  We then went back to the femur and put our femoral sizing guide based off the epicondylar axis and Whiteside's line.  Based off of this, we chose  a  size 3 femur.  We put in a 4-in-1 cutting block for a size 3 femur.  We made our anterior and posterior cuts, followed by our chamfer cuts.  We then made our femoral box cut.  Attention was then turned back to the tibia.  We chose a size 3 tibial tray  for coverage, setting the rotation off the tibial tubercle and the femur.  We made our keel punch off of this.  With the size 3 tibial tray in place followed by the trial 3 left femur, we tried a 9 mm polyethylene insert.  Based on some slight  hyperextension, we felt that she would be better off with the 11 mm insert.  We then made our patellar cut and drilled 3 holes for a size 29 patellar button.  We then removed all instrumentation from the knee and irrigated the knee with normal saline  solution using pulsatile lavage.  We then mixed our cement, and with the knee in a flexed position, cemented our Stryker tibial tray, size 3, followed by cementing our real size 3 femur.  We cleaned cement debris from the knee and placed our fixed  bearing size 11 mm thickness polyethylene liner.  We then cemented our patellar button.  We held the knee in an extended position, fully extended and compressed the knee down when the cement had hardened.  Once the cement had hardened, we let the  tourniquet down and hemostasis was obtained with electrocautery.  We irrigated the knee 1 additional time and then closed the joint capsule with interrupted #1 Vicryl suture, followed by 0 Vicryl in the deep tissue, 2-0 Vicryl in the subcutaneous tissue,  and a 4-0 Monocryl subcuticular stitch.  Steri-Strips were placed on the skin and a well-padded sterile dressing.  She was taken to the recovery room in stable condition.  All final counts were correct.  There were no complications noted.  Of note, Benita Stabile, PA-C, assisted during the entire case, and his assistance was crucial for facilitating all aspects of this case.  LN/NUANCE  D:11/19/2018 T:11/19/2018 JOB:[id number]

## 2018-11-19 NOTE — Brief Op Note (Signed)
11/19/2018  10:10 AM  PATIENT:  Lindsay Jimenez  68 y.o. female  PRE-OPERATIVE DIAGNOSIS:  left knee osteoarthritis endstage  POST-OPERATIVE DIAGNOSIS:  left knee osteoarthritis endstage  PROCEDURE:  Procedure(s): LEFT TOTAL KNEE ARTHROPLASTY (Left)  SURGEON:  Surgeon(s) and Role:    Mcarthur Rossetti, MD - Primary  PHYSICIAN ASSISTANT: Benita Stabile, PA-C  ANESTHESIA:   regional and spinal  EBL:  75 mL   COUNTS:  YES  TOURNIQUET:   Total Tourniquet Time Documented: Thigh (Left) - 58 minutes Total: Thigh (Left) - 58 minutes   DICTATION: .Other Dictation: Dictation Number [id number]  PLAN OF CARE: Admit to inpatient   PATIENT DISPOSITION:  PACU - hemodynamically stable.   Delay start of Pharmacological VTE agent (>24hrs) due to surgical blood loss or risk of bleeding: no

## 2018-11-19 NOTE — Plan of Care (Signed)
  Problem: Education: Goal: Knowledge of the prescribed therapeutic regimen will improve Outcome: Progressing Goal: Individualized Educational Video(s) Outcome: Progressing   Problem: Activity: Goal: Ability to avoid complications of mobility impairment will improve Outcome: Progressing Goal: Range of joint motion will improve Outcome: Progressing   Problem: Clinical Measurements: Goal: Postoperative complications will be avoided or minimized Outcome: Progressing   Problem: Pain Management: Goal: Pain level will decrease with appropriate interventions Outcome: Progressing   Problem: Skin Integrity: Goal: Will show signs of wound healing Outcome: Progressing   Problem: Education: Goal: Required Educational Video(s) Outcome: Progressing   Problem: Clinical Measurements: Goal: Ability to maintain clinical measurements within normal limits will improve Outcome: Progressing Goal: Postoperative complications will be avoided or minimized Outcome: Progressing   Problem: Skin Integrity: Goal: Demonstration of wound healing without infection will improve Outcome: Progressing   

## 2018-11-19 NOTE — Evaluation (Addendum)
Physical Therapy Evaluation Patient Details Name: Lindsay Jimenez MRN: [id number] DOB: Feb 16, 1950 Today's Date: 11/19/2018   History of Present Illness  s/p L TKA. PMH: R THA, R TKA  Clinical Impression  Pt is s/p TKA resulting in the deficits listed below (see PT Problem List).  Pt amb ~ 48' with RW and min assist. Anticipate steady progress in acute setting. Pt is hopeful to d/c home tomorrow.   Pt will benefit from skilled PT to increase their independence and safety with mobility to allow discharge to the venue listed below.      Follow Up Recommendations Follow surgeon's recommendation for DC plan and follow-up therapies    Equipment Recommendations  None recommended by PT    Recommendations for Other Services       Precautions / Restrictions Precautions Precautions: Knee Precaution Comments: able to do SLRs today, KI not used Required Braces or Orthoses: Knee Immobilizer - Left Knee Immobilizer - Left: Discontinue once straight leg raise with < 10 degree lag Restrictions Weight Bearing Restrictions: No Other Position/Activity Restrictions: WBAT      Mobility  Bed Mobility Overal bed mobility: Needs Assistance Bed Mobility: Supine to Sit     Supine to sit: Min assist     General bed mobility comments: light assist with RLE  Transfers Overall transfer level: Needs assistance Equipment used: Rolling walker (2 wheeled) Transfers: Sit to/from Stand Sit to Stand: Min assist         General transfer comment: cues for hand placement and RLE position  Ambulation/Gait Ambulation/Gait assistance: Min guard Gait Distance (Feet): 75 Feet Assistive device: Rolling walker (2 wheeled) Gait Pattern/deviations: Step-to pattern;Decreased stance time - left;Decreased weight shift to left     General Gait Details: cues for sequence initially  Stairs            Wheelchair Mobility    Modified Rankin (Stroke Patients Only)       Balance                                              Pertinent Vitals/Pain Pain Assessment: 0-10 Pain Score: 5  Pain Location: left knee Pain Descriptors / Indicators: Grimacing;Aching;Sore Pain Intervention(s): Limited activity within patient's tolerance;Monitored during session;Premedicated before session;Repositioned;Ice applied    Home Living Family/patient expects to be discharged to:: Private residence Living Arrangements: Alone Available Help at Discharge: Family;Available 24 hours/day Type of Home: House Home Access: Stairs to enter Entrance Stairs-Rails: None Entrance Stairs-Number of Steps: 3 garage and front Home Layout: Able to live on main level with bedroom/bathroom Home Equipment: Walker - 2 wheels;Bedside commode;Shower seat - built in      Prior Pension scheme manager        Extremity/Trunk Assessment   Upper Extremity Assessment Upper Extremity Assessment: Overall WFL for tasks assessed    Lower Extremity Assessment Lower Extremity Assessment: LLE deficits/detail LLE Deficits / Details: ankle WFL, knee and hip grossly 3/5, knee flexion ~ 5 to 65 degrees       Communication   Communication: No difficulties  Cognition Arousal/Alertness: Awake/alert Behavior During Therapy: WFL for tasks assessed/performed Overall Cognitive Status: Within Functional Limits for tasks assessed  General Comments      Exercises Total Joint Exercises Ankle Circles/Pumps: AROM;Both;10 reps Quad Sets: AROM;Both;5 reps Straight Leg Raises: AROM;5 reps;Left   Assessment/Plan    PT Assessment Patient needs continued PT services  PT Problem List Decreased strength;Decreased range of motion;Decreased activity tolerance;Decreased mobility;Decreased knowledge of use of DME       PT Treatment Interventions DME instruction;Gait training;Functional mobility training;Therapeutic activities;Patient/family  education;Therapeutic exercise;Stair training    PT Goals (Current goals can be found in the Care Plan section)  Acute Rehab PT Goals PT Goal Formulation: With patient Time For Goal Achievement: 11/26/18 Potential to Achieve Goals: Good    Frequency 7X/week   Barriers to discharge        Co-evaluation               AM-PAC PT "6 Clicks" Mobility  Outcome Measure Help needed turning from your back to your side while in a flat bed without using bedrails?: A Little Help needed moving from lying on your back to sitting on the side of a flat bed without using bedrails?: A Little Help needed moving to and from a bed to a chair (including a wheelchair)?: A Little Help needed standing up from a chair using your arms (e.g., wheelchair or bedside chair)?: A Little Help needed to walk in hospital room?: A Little Help needed climbing 3-5 steps with a railing? : A Little 6 Click Score: 18    End of Session Equipment Utilized During Treatment: Gait belt Activity Tolerance: Patient tolerated treatment well Patient left: in chair;with call bell/phone within reach;with chair alarm set   PT Visit Diagnosis: Difficulty in walking, not elsewhere classified (R26.2)    Time:  PT Time Calculation (min) (ACUTE ONLY): 26 min   Charges:   PT Evaluation $PT Eval Low Complexity: 1 Low PT Treatments $Gait Training: 8-22 mins        Kenyon Ana, PT  Pager: [phone] Acute Rehab Dept [facility]): [phone]   11/19/2018   [facility] 11/19/2018, 4:12 PM

## 2018-11-19 NOTE — H&P (Signed)
TOTAL KNEE ADMISSION H&P  Patient is being admitted for left total knee arthroplasty.  Subjective:  Chief Complaint:left knee pain.  HPI: Lindsay Jimenez, 68 y.o. female, has a history of pain and functional disability in the left knee due to arthritis and has failed non-surgical conservative treatments for greater than 12 weeks to includeNSAID's and/or analgesics, corticosteriod injections, viscosupplementation injections, flexibility and strengthening excercises and activity modification.  Onset of symptoms was gradual, starting 3 years ago with gradually worsening course since that time. The patient noted no past surgery on the left knee(s).  Patient currently rates pain in the left knee(s) at 10 out of 10 with activity. Patient has night pain, worsening of pain with activity and weight bearing, pain that interferes with activities of daily living, pain with passive range of motion, crepitus and joint swelling.  Patient has evidence of subchondral sclerosis, periarticular osteophytes and joint space narrowing by imaging studies. There is no active infection.  Patient Active Problem List   Diagnosis Date Noted  . Unilateral primary osteoarthritis, left knee 08/21/2016  . Chronic pain of left knee 08/21/2016  . Osteoarthritis of right knee 03/02/2015  . Status post total right knee replacement 03/02/2015  . Osteoarthritis of right hip 10/20/2014  . Status post total replacement of right hip 10/20/2014   Past Medical History:  Diagnosis Date  . Arthritis    oa  . Breast CA ([place]) 2002   bilateral  . Family history of adverse reaction to anesthesia    aunt had problem , not sure what problem  was  . GERD (gastroesophageal reflux disease)    occasional  . PONV (postoperative nausea and vomiting)     Past Surgical History:  Procedure Laterality Date  . ablation to both knees  april and may 2016   to deaden nerves  . KNEE SURGERY Right 1970   cartlidge removed  . MASTECTOMY Bilateral  2002  . OOPHORECTOMY Bilateral 2004  . TOTAL HIP ARTHROPLASTY Right 10/20/2014   Procedure: RIGHT TOTAL HIP ARTHROPLASTY ANTERIOR APPROACH;  Surgeon: Mcarthur Rossetti, MD;  Location: [facility];  Service: Orthopedics;  Laterality: Right;  . TOTAL KNEE ARTHROPLASTY Right 03/02/2015   Procedure: RIGHT TOTAL KNEE ARTHROPLASTY;  Surgeon: Mcarthur Rossetti, MD;  Location: [facility];  Service: Orthopedics;  Laterality: Right;    Current Facility-Administered Medications  Medication Dose Route Frequency Provider Last Rate Last Dose  . ceFAZolin (ANCEF) IVPB 2g/100 mL premix  2 g Intravenous On Call to OR Pete Pelt, PA-C      . chlorhexidine (HIBICLENS) 4 % liquid 4 application  60 mL Topical Once Erskine Emery W, PA-C      . fentaNYL (SUBLIMAZE) 100 MCG/2ML injection           . fentaNYL (SUBLIMAZE) injection 50-100 mcg  50-100 mcg Intravenous Madilyn Hook, MD      . lactated ringers infusion   Intravenous Continuous Mcarthur Rossetti, MD 10 mL/hr at 11/19/18 [phone]    . midazolam (VERSED) 2 MG/2ML injection           . midazolam (VERSED) injection 1-2 mg  1-2 mg Intravenous Madilyn Hook, MD      . povidone-iodine 10 % swab 2 application  2 application Topical Once Erskine Emery W, PA-C      . tranexamic acid (CYKLOKAPRON) IVPB 1,000 mg  1,000 mg Intravenous To OR Pete Pelt, PA-C       Allergies  Allergen Reactions  . Cyclosporine  Hives and Itching    Restasis eyedrops Rash, itching on hands Restasis eyedrops Rash, itching on hands  . Rivaroxaban Other (See Comments) and Itching  . Codeine Other (See Comments)    Loopy, "out of it", weak  . Latex Dermatitis  . Red Dye Swelling  . Tape Dermatitis    Tolerates paper tape, adhesive tape causes dermititis    Social History   Tobacco Use  . Smoking status: Never Smoker  . Smokeless tobacco: Never Used  Substance Use Topics  . Alcohol use: No    History reviewed. No pertinent family history.    Review of Systems  Musculoskeletal: Positive for joint pain.  All other systems reviewed and are negative.   Objective:  Physical Exam  Constitutional: She is oriented to person, place, and time. She appears well-developed and well-nourished.  HENT:  Head: Normocephalic and atraumatic.  Eyes: Pupils are equal, round, and reactive to light. EOM are normal.  Neck: Normal range of motion. Neck supple.  Cardiovascular: Normal rate.  Respiratory: Effort normal.  GI: Soft.  Musculoskeletal:     Left knee: She exhibits decreased range of motion, swelling, effusion, abnormal alignment, bony tenderness and abnormal meniscus. Tenderness found. Medial joint line and lateral joint line tenderness noted.  Neurological: She is alert and oriented to person, place, and time.  Skin: Skin is warm.  Psychiatric: She has a normal mood and affect.    Vital signs in last 24 hours: Temp:  [98.6 F (37 C)] 98.6 F (37 C) (10/09 0634) Pulse Rate:  [86] 86 (10/09 0634) Resp:  [18] 18 (10/09 0634) BP: (143)/(76) 143/76 (10/09 0634) SpO2:  [98 %] 98 % (10/09 0634) Weight:  [88.2 kg] 88.2 kg (10/09 0634)  Labs:   Estimated body mass index is 29.56 kg/m as calculated from the following:   Height as of this encounter: 5\' 8"  (1.727 m).   Weight as of this encounter: 88.2 kg.   Imaging Review Plain radiographs demonstrate severe degenerative joint disease of the left knee(s). The overall alignment ismild varus. The bone quality appears to be excellent for age and reported activity level.      Assessment/Plan:  End stage arthritis, left knee   The patient history, physical examination, clinical judgment of the provider and imaging studies are consistent with end stage degenerative joint disease of the left knee(s) and total knee arthroplasty is deemed medically necessary. The treatment options including medical management, injection therapy arthroscopy and arthroplasty were discussed at  length. The risks and benefits of total knee arthroplasty were presented and reviewed. The risks due to aseptic loosening, infection, stiffness, patella tracking problems, thromboembolic complications and other imponderables were discussed. The patient acknowledged the explanation, agreed to proceed with the plan and consent was signed. Patient is being admitted for inpatient treatment for surgery, pain control, PT, OT, prophylactic antibiotics, VTE prophylaxis, progressive ambulation and ADL's and discharge planning. The patient is planning to be discharged home with home health services     Patient's anticipated LOS is less than 2 midnights, meeting these requirements: - Younger than 19 - Lives within 1 hour of care - Has a competent adult at home to recover with post-op recover - NO history of  - Chronic pain requiring opiods  - Diabetes  - Coronary Artery Disease  - Heart failure  - Heart attack  - Stroke  - DVT/VTE  - Cardiac arrhythmia  - Respiratory Failure/COPD  - Renal failure  - Anemia  - Advanced Liver disease

## 2018-11-19 NOTE — Anesthesia Postprocedure Evaluation (Signed)
Anesthesia Post Note  Patient: Lindsay Jimenez  Procedure(s) Performed: LEFT TOTAL KNEE ARTHROPLASTY (Left Knee)     Patient location during evaluation: PACU Anesthesia Type: Spinal Level of consciousness: oriented and awake and alert Pain management: pain level controlled Vital Signs Assessment: post-procedure vital signs reviewed and stable Respiratory status: spontaneous breathing, respiratory function stable and patient connected to nasal cannula oxygen Cardiovascular status: blood pressure returned to baseline and stable Postop Assessment: no headache, no backache and no apparent nausea or vomiting Anesthetic complications: no    Last Vitals:  Vitals:   11/19/18 1200 11/19/18 1215  BP: 110/65 116/68  Pulse: 72 75  Resp: 18 19  Temp:  (!) 36.4 C  SpO2: 100% 100%    Last Pain:  Vitals:   11/19/18 1215  TempSrc:   PainSc: 0-No pain                 Effie Berkshire

## 2018-11-19 NOTE — Anesthesia Preprocedure Evaluation (Addendum)
Anesthesia Evaluation  Patient identified by MRN, date of birth, ID band Patient awake    Reviewed: Allergy & Precautions, NPO status , Patient's Chart, lab work & pertinent test results  History of Anesthesia Complications (+) PONV  Airway Mallampati: II  TM Distance: >3 FB Neck ROM: Full    Dental  (+) Teeth Intact, Dental Advisory Given   Pulmonary    breath sounds clear to auscultation       Cardiovascular negative cardio ROS   Rhythm:Regular Rate:Normal     Neuro/Psych negative neurological ROS  negative psych ROS   GI/Hepatic Neg liver ROS, GERD  Medicated,  Endo/Other  negative endocrine ROS  Renal/GU negative Renal ROS     Musculoskeletal  (+) Arthritis ,   Abdominal Normal abdominal exam  (+)   Peds  Hematology negative hematology ROS (+)   Anesthesia Other Findings   Reproductive/Obstetrics negative OB ROS                            Anesthesia Physical Anesthesia Plan  ASA: II  Anesthesia Plan: Spinal   Post-op Pain Management:  Regional for Post-op pain   Induction: Intravenous  PONV Risk Score and Plan: 4 or greater and Ondansetron, Propofol infusion, Midazolam and Dexamethasone  Airway Management Planned: Natural Airway and Simple Face Mask  Additional Equipment: None  Intra-op Plan:   Post-operative Plan:   Informed Consent: I have reviewed the patients History and Physical, chart, labs and discussed the procedure including the risks, benefits and alternatives for the proposed anesthesia with the patient or authorized representative who has indicated his/her understanding and acceptance.       Plan Discussed with: CRNA  Anesthesia Plan Comments:       Anesthesia Quick Evaluation

## 2018-11-19 NOTE — Transfer of Care (Signed)
Immediate Anesthesia Transfer of Care Note  Patient: Lindsay Jimenez  Procedure(s) Performed: LEFT TOTAL KNEE ARTHROPLASTY (Left Knee)  Patient Location: PACU  Anesthesia Type:Spinal  Level of Consciousness: awake, alert  and oriented  Airway & Oxygen Therapy: Patient Spontanous Breathing and Patient connected to face mask oxygen  Post-op Assessment: Report given to RN and Post -op Vital signs reviewed and stable  Post vital signs: Reviewed and stable  Last Vitals:  Vitals Value Taken Time  BP    Temp    Pulse 83 11/19/18 1046  Resp 13 11/19/18 1046  SpO2 100 % 11/19/18 1046  Vitals shown include unvalidated device data.  Last Pain:  Vitals:   11/19/18 0759  TempSrc:   PainSc: 0-No pain         Complications: No apparent anesthesia complications

## 2018-11-19 NOTE — Anesthesia Procedure Notes (Signed)
Anesthesia Regional Block: Adductor canal block   Pre-Anesthetic Checklist: ,, timeout performed, Correct Patient, Correct Site, Correct Laterality, Correct Procedure, Correct Position, site marked, Risks and benefits discussed,  Surgical consent,  Pre-op evaluation,  At surgeon's request and post-op pain management  Laterality: Left  Prep: chloraprep       Needles:  Injection technique: Single-shot  Needle Type: Echogenic Stimulator Needle     Needle Length: 9cm  Needle Gauge: 21     Additional Needles:   Procedures:,,,, ultrasound used (permanent image in chart),,,,  Narrative:  Start time: 11/19/2018 7:55 AM End time: 11/19/2018 8:05 AM Injection made incrementally with aspirations every 5 mL.  Performed by: Personally  Anesthesiologist: Effie Berkshire, MD  Additional Notes: Patient tolerated the procedure well. Local anesthetic introduced in an incremental fashion under minimal resistance after negative aspirations. No paresthesias were elicited. After completion of the procedure, no acute issues were identified and patient continued to be monitored by RN.

## 2018-11-19 NOTE — Anesthesia Procedure Notes (Signed)
Spinal  Patient location during procedure: OR Start time: 11/19/2018 8:45 AM End time: 11/19/2018 8:51 AM Staffing Resident/CRNA: Reardon, Diana L, CRNA Performed: resident/CRNA  Preanesthetic Checklist Completed: patient identified, site marked, surgical consent, pre-op evaluation, timeout performed, IV checked, risks and benefits discussed and monitors and equipment checked Spinal Block Patient position: sitting Prep: site prepped and draped and DuraPrep Patient monitoring: heart rate, continuous pulse ox and blood pressure Approach: midline Location: L3-4 Injection technique: single-shot Needle Needle type: Sprotte  Needle gauge: 24 G Needle length: 9 cm Additional Notes Kit expiration date 12/11/2018 and lot # [id number] Clear free flow CSF prior to injection and after injection, negative heme, negative paresthesia Tolerated well and returned to supine position     

## 2018-11-19 NOTE — Progress Notes (Signed)
Assisted Dr. Hollis with left, ultrasound guided, adductor canal block. Side rails up, monitors on throughout procedure. See vital signs in flow sheet. Tolerated Procedure well.  

## 2018-11-19 NOTE — Anesthesia Procedure Notes (Signed)
Date/Time: 11/19/2018 8:42 AM Performed by: Sharlette Dense, CRNA Oxygen Delivery Method: Simple face mask

## 2018-11-20 DIAGNOSIS — M170 Bilateral primary osteoarthritis of knee: Secondary | ICD-10-CM | POA: Diagnosis not present

## 2018-11-20 LAB — BASIC METABOLIC PANEL
Anion gap: 9 (ref 5–15)
BUN: 11 mg/dL (ref 8–23)
CO2: 26 mmol/L (ref 22–32)
Calcium: 8.7 mg/dL — ABNORMAL LOW (ref 8.9–10.3)
Chloride: 104 mmol/L (ref 98–111)
Creatinine, Ser: 0.71 mg/dL (ref 0.44–1.00)
GFR calc Af Amer: >60 mL/min (ref >60)
GFR calc non Af Amer: >60 mL/min (ref >60)
Glucose, Bld: 119 mg/dL — ABNORMAL HIGH (ref 70–99)
Potassium: 3.9 mmol/L (ref 3.5–5.1)
Sodium: 139 mmol/L (ref 135–145)

## 2018-11-20 LAB — CBC
HCT: 34.8 % — ABNORMAL LOW (ref 36.0–46.0)
Hemoglobin: 11.4 g/dL — ABNORMAL LOW (ref 12.0–15.0)
MCH: 30.4 pg (ref 26.0–34.0)
MCHC: 32.8 g/dL (ref 30.0–36.0)
MCV: 92.8 fL (ref 80.0–100.0)
Platelets: 203 10*3/uL (ref 150–400)
RBC: 3.75 MIL/uL — ABNORMAL LOW (ref 3.87–5.11)
RDW: 12 % (ref 11.5–15.5)
WBC: 12.5 10*3/uL — ABNORMAL HIGH (ref 4.0–10.5)
nRBC: 0 % (ref 0.0–0.2)

## 2018-11-20 MED ORDER — ACETAMINOPHEN 325 MG PO TABS: 325.0000 mg | ORAL_TABLET | Freq: Four times a day (QID) | ORAL | 0 refills | Status: DC | PRN

## 2018-11-20 MED ORDER — OXYCODONE HCL 5 MG PO TABS: 5.0000 mg | ORAL_TABLET | ORAL | 0 refills | Status: DC | PRN

## 2018-11-20 MED ORDER — ASPIRIN 81 MG PO TBEC: 81.0000 mg | DELAYED_RELEASE_TABLET | Freq: Two times a day (BID) | ORAL | 0 refills | Status: DC

## 2018-11-20 MED ORDER — ONDANSETRON 4 MG PO TBDP: 4.0000 mg | ORAL_TABLET | Freq: Three times a day (TID) | ORAL | 0 refills | Status: DC | PRN

## 2018-11-20 NOTE — Discharge Instructions (Signed)

## 2018-11-20 NOTE — Progress Notes (Signed)
Physical Therapy Treatment Patient Details Name: Lindsay Jimenez MRN: [id number] DOB: Oct 31, 1950 Today's Date: 11/20/2018    History of Present Illness s/p L TKA. PMH: R THA, R TKA    PT Comments    Pt performed home therex program with assist.  Written instruction provided and reviewed.   Follow Up Recommendations  Follow surgeon's recommendation for DC plan and follow-up therapies     Equipment Recommendations  None recommended by PT    Recommendations for Other Services       Precautions / Restrictions Precautions Precautions: Knee Precaution Comments: able to do SLRs today, KI not used Required Braces or Orthoses: Knee Immobilizer - Left Knee Immobilizer - Left: Discontinue once straight leg raise with < 10 degree lag Restrictions Weight Bearing Restrictions: No Other Position/Activity Restrictions: WBAT    Mobility  Bed Mobility               General bed mobility comments: NT - pt up in chair and requests back to same  Transfers Overall transfer level: Needs assistance Equipment used: Rolling walker (2 wheeled) Transfers: Sit to/from Stand Sit to Stand: Min guard;Supervision         General transfer comment: min cues for hand placement and RLE position  Ambulation/Gait Ambulation/Gait assistance: Min Gaffer (Feet): 120 Feet(and 15' into bathroom) Assistive device: Rolling walker (2 wheeled) Gait Pattern/deviations: Decreased stance time - left;Decreased weight shift to left;Step-to pattern;Step-through pattern Gait velocity: decr   General Gait Details: cues for position from RW and initial sequence   Stairs Stairs: Yes Stairs assistance: Min assist Stair Management: No rails;Step to pattern;Forwards;With walker Number of Stairs: 2 General stair comments: single step twice with cues for sequence and foot/RW placement   Wheelchair Mobility    Modified Rankin (Stroke Patients Only)       Balance Overall  balance assessment: Mild deficits observed, not formally tested                                          Cognition Arousal/Alertness: Awake/alert Behavior During Therapy: WFL for tasks assessed/performed Overall Cognitive Status: Within Functional Limits for tasks assessed                                        Exercises Total Joint Exercises Ankle Circles/Pumps: AROM;Both;15 reps;Supine Quad Sets: AROM;Both;10 reps;Supine Heel Slides: AAROM;Left;15 reps;Supine Straight Leg Raises: AAROM;AROM;Left;15 reps;Supine    General Comments        Pertinent Vitals/Pain Pain Assessment: 0-10 Pain Score: 7  Pain Location: left knee Pain Descriptors / Indicators: Aching;Sore Pain Intervention(s): Limited activity within patient's tolerance;Monitored during session;Premedicated before session;Patient requesting pain meds-RN notified    Home Living                      Prior Function            PT Goals (current goals can now be found in the care plan section) Acute Rehab PT Goals Patient Stated Goal: Regain IND PT Goal Formulation: With patient Time For Goal Achievement: 11/26/18 Potential to Achieve Goals: Good Progress towards PT goals: Progressing toward goals    Frequency    7X/week      PT Plan Current plan remains appropriate    Co-evaluation  AM-PAC PT "6 Clicks" Mobility   Outcome Measure  Help needed turning from your back to your side while in a flat bed without using bedrails?: A Little Help needed moving from lying on your back to sitting on the side of a flat bed without using bedrails?: A Little Help needed moving to and from a bed to a chair (including a wheelchair)?: A Little Help needed standing up from a chair using your arms (e.g., wheelchair or bedside chair)?: A Little Help needed to walk in hospital room?: A Little Help needed climbing 3-5 steps with a railing? : A Little 6 Click  Score: 18    End of Session Equipment Utilized During Treatment: Gait belt Activity Tolerance: Patient tolerated treatment well Patient left: in chair;with call bell/phone within reach;with chair alarm set Nurse Communication: Mobility status PT Visit Diagnosis: Difficulty in walking, not elsewhere classified (R26.2)     Time: [id number] PT Time Calculation (min) (ACUTE ONLY): 21 min  Charges:  $Gait Training: 8-22 mins $Therapeutic Exercise: 8-22 mins                     [place] Pager [phone] Office [phone]    Mesiah Manzo 11/20/2018, 4:50 PM

## 2018-11-20 NOTE — Progress Notes (Signed)
Physical Therapy Treatment Patient Details Name: Lindsay Jimenez MRN: [id number] DOB: 1950/06/29 Today's Date: 11/20/2018    History of Present Illness s/p L TKA. PMH: R THA, R TKA    PT Comments    Pt progressing well with mobility and eager for dc home this date.   Follow Up Recommendations  Follow surgeon's recommendation for DC plan and follow-up therapies     Equipment Recommendations  None recommended by PT    Recommendations for Other Services       Precautions / Restrictions Precautions Precautions: Knee Precaution Comments: able to do SLRs today, KI not used Required Braces or Orthoses: Knee Immobilizer - Left Knee Immobilizer - Left: Discontinue once straight leg raise with < 10 degree lag Restrictions Weight Bearing Restrictions: No Other Position/Activity Restrictions: WBAT    Mobility  Bed Mobility Overal bed mobility: Needs Assistance Bed Mobility: Supine to Sit     Supine to sit: Min guard        Transfers Overall transfer level: Needs assistance Equipment used: Rolling walker (2 wheeled) Transfers: Sit to/from Stand Sit to Stand: Min guard         General transfer comment: cues for hand placement and RLE position  Ambulation/Gait Ambulation/Gait assistance: Min guard Gait Distance (Feet): 150 Feet Assistive device: Rolling walker (2 wheeled) Gait Pattern/deviations: Decreased stance time - left;Decreased weight shift to left;Step-to pattern;Step-through pattern     General Gait Details: cues for position from RW and initial sequence   Stairs             Wheelchair Mobility    Modified Rankin (Stroke Patients Only)       Balance Overall balance assessment: Mild deficits observed, not formally tested                                          Cognition Arousal/Alertness: Awake/alert Behavior During Therapy: WFL for tasks assessed/performed Overall Cognitive Status: Within Functional Limits for tasks  assessed                                        Exercises Total Joint Exercises Ankle Circles/Pumps: AROM;Both;15 reps;Supine Quad Sets: AROM;Both;10 reps;Supine Heel Slides: AAROM;Left;15 reps;Supine Straight Leg Raises: AAROM;AROM;Left;15 reps;Supine Goniometric ROM: AAROM L knee -8 - 45    General Comments        Pertinent Vitals/Pain Pain Assessment: 0-10 Pain Score: 6  Pain Location: left knee Pain Descriptors / Indicators: Grimacing;Aching;Sore Pain Intervention(s): Limited activity within patient's tolerance;Monitored during session;Premedicated before session;Ice applied    Home Living                      Prior Function            PT Goals (current goals can now be found in the care plan section) Acute Rehab PT Goals Patient Stated Goal: Regain IND PT Goal Formulation: With patient Time For Goal Achievement: 11/26/18 Potential to Achieve Goals: Good Progress towards PT goals: Progressing toward goals    Frequency    7X/week      PT Plan Current plan remains appropriate    Co-evaluation              AM-PAC PT "6 Clicks" Mobility   Outcome Measure  Help needed turning from your  back to your side while in a flat bed without using bedrails?: A Little Help needed moving from lying on your back to sitting on the side of a flat bed without using bedrails?: A Little Help needed moving to and from a bed to a chair (including a wheelchair)?: A Little Help needed standing up from a chair using your arms (e.g., wheelchair or bedside chair)?: A Little Help needed to walk in hospital room?: A Little Help needed climbing 3-5 steps with a railing? : A Little 6 Click Score: 18    End of Session Equipment Utilized During Treatment: Gait belt Activity Tolerance: Patient tolerated treatment well Patient left: in chair;with call bell/phone within reach;with chair alarm set Nurse Communication: Mobility status PT Visit Diagnosis:  Difficulty in walking, not elsewhere classified (R26.2)     Time: 1040-1103 PT Time Calculation (min) (ACUTE ONLY): 23 min  Charges:  $Gait Training: 8-22 mins $Therapeutic Exercise: 8-22 mins                     [place] Pager [phone] Office [phone]    Mafalda Mcginniss 11/20/2018, 12:03 PM

## 2018-11-20 NOTE — Progress Notes (Signed)
Physical Therapy Treatment Patient Details Name: Lindsay Jimenez MRN: [id number] DOB: [birth year] Today's Date: 11/20/2018    History of Present Illness s/p L TKA. PMH: R THA, R TKA    PT Comments    Pt motivated and mobilizing well including negotiating stairs until onset of nausea.  Pt returned to chair and RN alerted.  Will follow up with home therex program.  Follow Up Recommendations  Follow surgeon's recommendation for DC plan and follow-up therapies     Equipment Recommendations  None recommended by PT    Recommendations for Other Services       Precautions / Restrictions Precautions Precautions: Knee Precaution Comments: able to do SLRs today, KI not used Required Braces or Orthoses: Knee Immobilizer - Left Knee Immobilizer - Left: Discontinue once straight leg raise with < 10 degree lag Restrictions Weight Bearing Restrictions: No Other Position/Activity Restrictions: WBAT    Mobility  Bed Mobility               General bed mobility comments: NT - pt up in chair and requests back to same  Transfers Overall transfer level: Needs assistance Equipment used: Rolling walker (2 wheeled) Transfers: Sit to/from Stand Sit to Stand: Min guard;Supervision         General transfer comment: min cues for hand placement and RLE position  Ambulation/Gait Ambulation/Gait assistance: Min Gaffer (Feet): 120 Feet(and 15' into bathroom) Assistive device: Rolling walker (2 wheeled) Gait Pattern/deviations: Decreased stance time - left;Decreased weight shift to left;Step-to pattern;Step-through pattern Gait velocity: decr   General Gait Details: cues for position from RW and initial sequence   Stairs Stairs: Yes Stairs assistance: Min assist Stair Management: No rails;Step to pattern;Forwards;With walker Number of Stairs: 2 General stair comments: single step twice with cues for sequence and foot/RW placement   Wheelchair Mobility     Modified Rankin (Stroke Patients Only)       Balance Overall balance assessment: Mild deficits observed, not formally tested                                          Cognition Arousal/Alertness: Awake/alert Behavior During Therapy: WFL for tasks assessed/performed Overall Cognitive Status: Within Functional Limits for tasks assessed                                        Exercises      General Comments        Pertinent Vitals/Pain Pain Assessment: 0-10 Pain Score: 5  Pain Location: left knee Pain Descriptors / Indicators: Aching;Sore Pain Intervention(s): Limited activity within patient's tolerance;Monitored during session;Premedicated before session;Ice applied    Home Living                      Prior Function            PT Goals (current goals can now be found in the care plan section) Acute Rehab PT Goals Patient Stated Goal: Regain IND PT Goal Formulation: With patient Time For Goal Achievement: 11/26/18 Potential to Achieve Goals: Good Progress towards PT goals: Progressing toward goals    Frequency    7X/week      PT Plan Current plan remains appropriate    Co-evaluation  AM-PAC PT "6 Clicks" Mobility   Outcome Measure  Help needed turning from your back to your side while in a flat bed without using bedrails?: A Little Help needed moving from lying on your back to sitting on the side of a flat bed without using bedrails?: A Little Help needed moving to and from a bed to a chair (including a wheelchair)?: A Little Help needed standing up from a chair using your arms (e.g., wheelchair or bedside chair)?: A Little Help needed to walk in hospital room?: A Little Help needed climbing 3-5 steps with a railing? : A Little 6 Click Score: 18    End of Session Equipment Utilized During Treatment: Gait belt Activity Tolerance: Patient tolerated treatment well Patient left: in chair;with  call bell/phone within reach;with chair alarm set Nurse Communication: Mobility status PT Visit Diagnosis: Difficulty in walking, not elsewhere classified (R26.2)     Time: 1310-1331 PT Time Calculation (min) (ACUTE ONLY): 21 min  Charges:  $Gait Training: 8-22 mins                     [place] Pager [phone] Office [phone]    Ruford Dudzinski 11/20/2018, 4:46 PM

## 2018-11-20 NOTE — Progress Notes (Signed)
  Subjective: Patient stable.  She is doing well with fairly minimal pain.  She would very much like to be discharged today.  Therapist agrees that that is a high likelihood.   Objective: Vital signs in last 24 hours: Temp:  [97.5 F (36.4 C)-99.3 F (37.4 C)] 99.3 F (37.4 C) (10/10 0934) Pulse Rate:  [69-94] 84 (10/10 0934) Resp:  [11-20] 16 (10/10 0934) BP: (100-127)/(60-72) 117/67 (10/10 0934) SpO2:  [96 %-100 %] 98 % (10/10 0934) Weight:  [88 kg] 88 kg (10/09 1300)  Intake/Output from previous day: 10/09 0701 - 10/10 0700 In: 3654.1 [P.O.:770; I.V.:2584.1; IV Piggyback:300] Out: 2525 [Urine:2450; Blood:75] Intake/Output this shift: No intake/output data recorded.  Exam:  Dorsiflexion/Plantar flexion intact  Labs: Recent Labs    11/20/18 0358  HGB 11.4*   Recent Labs    11/20/18 0358  WBC 12.5*  RBC 3.75*  HCT 34.8*  PLT 203   Recent Labs    11/20/18 0358  NA 139  K 3.9  CL 104  CO2 26  BUN 11  CREATININE 0.71  GLUCOSE 119*  CALCIUM 8.7*   No results for input(s): LABPT, INR in the last 72 hours.  Assessment/Plan: Plan at this time is to plan for discharge early this afternoon after therapy works with her.   G Scott Azarah Dacy 11/20/2018, 9:45 AM

## 2018-11-20 NOTE — Discharge Summary (Signed)
Patient ID: Lindsay Jimenez MRN: [id number] DOB/AGE: 04-01-1950 68 y.o.  Admit date: 11/19/2018 Discharge date: 11/20/2018  Admission Diagnoses:  Principal Problem:   Unilateral primary osteoarthritis, left knee Active Problems:   Status post total left knee replacement   Discharge Diagnoses:  Same  Past Medical History:  Diagnosis Date  . Arthritis    oa  . Breast CA ([place]) 2002   bilateral  . Family history of adverse reaction to anesthesia    aunt had problem , not sure what problem  was  . GERD (gastroesophageal reflux disease)    occasional  . PONV (postoperative nausea and vomiting)     Surgeries: Procedure(s): LEFT TOTAL KNEE ARTHROPLASTY on 11/19/2018   Consultants:   Discharged Condition: Improved  Hospital Course: Lindsay Jimenez is an 68 y.o. female who was admitted 11/19/2018 for operative treatment ofUnilateral primary osteoarthritis, left knee. Patient has severe unremitting pain that affects sleep, daily activities, and work/hobbies. After pre-op clearance the patient was taken to the operating room on 11/19/2018 and underwent  Procedure(s): LEFT TOTAL KNEE ARTHROPLASTY.    Patient was given perioperative antibiotics:  Anti-infectives (From admission, onward)   Start     Dose/Rate Route Frequency Ordered Stop   11/19/18 1500  ceFAZolin (ANCEF) IVPB 1 g/50 mL premix     1 g 100 mL/hr over 30 Minutes Intravenous Every 6 hours 11/19/18 1243 11/19/18 2044   11/19/18 0645  ceFAZolin (ANCEF) IVPB 2g/100 mL premix     2 g 200 mL/hr over 30 Minutes Intravenous On call to O.R. 11/19/18 [id number] 11/19/18 0908       Patient was given sequential compression devices, early ambulation, and chemoprophylaxis to prevent DVT.  Patient benefited maximally from hospital stay and there were no complications.    Recent vital signs:  Patient Vitals for the past 24 hrs:  BP Temp Temp src Pulse Resp SpO2  11/20/18 1347 110/60 98.7 F (37.1 C) Oral 95 15 99 %  11/20/18 0934  117/67 99.3 F (37.4 C) Oral 84 16 98 %  11/20/18 0446 113/64 98.4 F (36.9 C) Oral 71 16 98 %  11/20/18 0054 100/63 98 F (36.7 C) Oral 74 16 97 %  11/19/18 2057 109/68 98.4 F (36.9 C) Oral 78 20 96 %  11/19/18 1558 127/72 98.5 F (36.9 C) - 93 17 96 %  11/19/18 1500 111/60 98 F (36.7 C) Oral 93 17 99 %  11/19/18 1412 121/70 98.5 F (36.9 C) Oral 94 16 99 %     Recent laboratory studies:  Recent Labs    11/20/18 0358  WBC 12.5*  HGB 11.4*  HCT 34.8*  PLT 203  NA 139  K 3.9  CL 104  CO2 26  BUN 11  CREATININE 0.71  GLUCOSE 119*  CALCIUM 8.7*     Discharge Medications:   Allergies as of 11/20/2018      Reactions   Cyclosporine Hives, Itching   Restasis eyedrops Rash, itching on hands Restasis eyedrops Rash, itching on hands   Rivaroxaban Other (See Comments), Itching   Codeine Other (See Comments)   Loopy, "out of it", weak   Latex Dermatitis   Red Dye Swelling   Tape Dermatitis   Tolerates paper tape, adhesive tape causes dermititis      Medication List    STOP taking these medications   ciprofloxacin 500 MG tablet Commonly known as: CIPRO   metroNIDAZOLE 500 MG tablet Commonly known as: FLAGYL     TAKE these  medications   acetaminophen 325 MG tablet Commonly known as: TYLENOL Take 1-2 tablets (325-650 mg total) by mouth every 6 (six) hours as needed for mild pain (pain score 1-3 or temp > 100.5).   Align 4 MG Caps Take 4 mg by mouth at bedtime.   aspirin 81 MG EC tablet Take 1 tablet (81 mg total) by mouth 2 (two) times daily.   CALCIUM CITRATE + D PO Take 400 mg by mouth 2 (two) times daily.   cetirizine 10 MG tablet Commonly known as: ZYRTEC Take 10 mg by mouth daily as needed for allergies.   CITRACAL PLUS PO Take 1 tablet by mouth daily.   diphenhydrAMINE 25 MG tablet Commonly known as: BENADRYL Take 25 mg by mouth every 6 (six) hours as needed for allergies.   famotidine 20 MG tablet Commonly known as: PEPCID Take 20 mg  by mouth daily as needed for heartburn or indigestion.   fluticasone 50 MCG/ACT nasal spray Commonly known as: FLONASE Place 1 spray into both nostrils daily as needed for allergies or rhinitis.   ibuprofen 200 MG tablet Commonly known as: ADVIL Take 400 mg by mouth 2 (two) times daily.   methocarbamol 500 MG tablet Commonly known as: ROBAXIN Take 1 tablet (500 mg total) by mouth every 6 (six) hours as needed for muscle spasms. What changed: additional instructions   Misc. Devices Misc Breast prostheses, 1 pair, refill x 3.   multivitamin with minerals Tabs tablet Take 1 tablet by mouth daily.   ondansetron 4 MG disintegrating tablet Commonly known as: Zofran ODT Take 1 tablet (4 mg total) by mouth every 8 (eight) hours as needed for nausea or vomiting.   oxyCODONE 5 MG immediate release tablet Commonly known as: Oxy IR/ROXICODONE Take 1-2 tablets (5-10 mg total) by mouth every 4 (four) hours as needed for moderate pain (pain score 4-6).   SYSTANE OP Place 1 drop into both eyes 4 (four) times daily as needed (for eye dryness).            Durable Medical Equipment  (From admission, onward)         Start     Ordered   11/19/18 1243  DME 3 n 1  Once     11/19/18 1243   11/19/18 1243  DME Walker rolling  Once    Question:  Patient needs a walker to treat with the following condition  Answer:  Status post total left knee replacement   11/19/18 1243          Diagnostic Studies: Dg Knee Left Port  Result Date: 11/19/2018 CLINICAL DATA:  Osteoarthritis of the left knee. Status post total knee arthroplasty. EXAM: PORTABLE LEFT KNEE - 1-2 VIEW COMPARISON:  Radiographs dated 10/13/2018 FINDINGS: The components of the left total knee prosthesis appear in excellent position. No fractures. Expected postsurgical air in the soft tissues. IMPRESSION: Satisfactory appearance of the left knee after total knee replacement. Electronically Signed   By: Lorriane Shire M.D.   On:  11/19/2018 11:48    Disposition: Discharge disposition: 01-Home or Self Care       Discharge Instructions    Call MD / Call 911   Complete by: As directed    If you experience chest pain or shortness of breath, CALL 911 and be transported to the hospital emergency room.  If you develope a fever above 101 F, pus (white drainage) or increased drainage or redness at the wound, or calf pain, call your surgeon's  office.   Constipation Prevention   Complete by: As directed    Drink plenty of fluids.  Prune juice may be helpful.  You may use a stool softener, such as Colace (over the counter) 100 mg twice a day.  Use MiraLax (over the counter) for constipation as needed.   Diet - low sodium heart healthy   Complete by: As directed    Increase activity slowly as tolerated   Complete by: As directed       Follow-up Information    Mcarthur Rossetti, MD Follow up in 2 week(s).   Specialty: Orthopedic Surgery Contact information: [place] [address] [phone]        [facility] Follow up.   Specialty: [place] [facility]: [facility] physical therapy Contact information: [address] [place] [address] [phone]            Signed: Mcarthur Rossetti 11/20/2018, 1:48 PM

## 2018-11-22 ENCOUNTER — Encounter (HOSPITAL_COMMUNITY): Payer: Self-pay | Admitting: Orthopaedic Surgery

## 2018-11-23 ENCOUNTER — Telehealth: Payer: Self-pay

## 2018-11-23 NOTE — Telephone Encounter (Signed)
Patient's PT called wanting verbal orders for physical therapy services. Please call back at [phone]

## 2018-11-23 NOTE — Telephone Encounter (Signed)
Verbal order given  

## 2018-11-29 ENCOUNTER — Encounter: Payer: Self-pay | Admitting: Orthopaedic Surgery

## 2018-11-29 ENCOUNTER — Ambulatory Visit (INDEPENDENT_AMBULATORY_CARE_PROVIDER_SITE_OTHER): Payer: Medicare Other | Admitting: Orthopaedic Surgery

## 2018-11-29 ENCOUNTER — Other Ambulatory Visit: Payer: Self-pay

## 2018-11-29 DIAGNOSIS — Z96652 Presence of left artificial knee joint: Secondary | ICD-10-CM

## 2018-11-29 NOTE — Telephone Encounter (Signed)
Called patient

## 2018-11-29 NOTE — Progress Notes (Signed)
HPI: [name] returns now 2 weeks status post left total knee arthroplasty.  She is overall doing well: She is ambulating with a rolling walker.  Taking no pain medication.  She is on aspirin twice daily for DVT prophylaxis.  She denies any chest pain shortness of breath, fevers or chills.  She has had some tenderness in her calf but this is resolving.  She denies any swelling of the left lower leg.  Physical exam: Left knee full extension flexion to 90 degrees no instability valgus varus stressing.  Surgical incisions healing well no signs of infection.  Skin is well approximated running subcutaneous stitch.  Impression: 2-week status post left total knee arthroplasty  Plan: She will follow-up with [place] in 1 month sooner if there is any questions concerns.  Prescription for outpatient physical therapy was given.  Scar tissue mobilization encouraged.

## 2018-12-27 ENCOUNTER — Encounter: Payer: Self-pay | Admitting: Orthopaedic Surgery

## 2018-12-27 ENCOUNTER — Ambulatory Visit (INDEPENDENT_AMBULATORY_CARE_PROVIDER_SITE_OTHER): Payer: Medicare Other | Admitting: Orthopaedic Surgery

## 2018-12-27 DIAGNOSIS — Z96652 Presence of left artificial knee joint: Secondary | ICD-10-CM

## 2018-12-27 NOTE — Progress Notes (Signed)
The patient is 5 weeks status post a left total knee arthroplasty.  She says that her range of motion and strength are improving.  She is ambulate with a cane.  She has a history of a right total knee arthroplasty.  She feels like she is further along with this knee at this time and she was with her right knee.  On exam her extension is almost full and her flexion is to about 95 degrees.  There is still moderate swelling of the knee.  The knee feels ligamentously stable.  She will continue to push herself through therapy.  She will transition away from a cane when she feels comfortable to do so.  She is cleared to drive.  All question concerns were answered and addressed.  We will see her back in 4 weeks for clinical exam only.  No x-rays are needed.

## 2019-01-24 ENCOUNTER — Ambulatory Visit (INDEPENDENT_AMBULATORY_CARE_PROVIDER_SITE_OTHER): Payer: Medicare Other | Admitting: Orthopaedic Surgery

## 2019-01-24 ENCOUNTER — Encounter: Payer: Self-pay | Admitting: Orthopaedic Surgery

## 2019-01-24 DIAGNOSIS — Z96652 Presence of left artificial knee joint: Secondary | ICD-10-CM

## 2019-01-24 DIAGNOSIS — Z96651 Presence of right artificial knee joint: Secondary | ICD-10-CM

## 2019-01-24 NOTE — Progress Notes (Signed)
Lindsay Jimenez is status post a left total knee arthroplasty done 9 weeks ago.  We have also replaced her right knee.  She is scheduled to go play golf later this month and have cleared her to do so.  She says her range of motion and strength are improving with therapy.  On examination of her left operative knee there are still some swelling.  Her extension is full and her flexion is to about 105 degrees.  Her extension and flexion are almost full on the right side.  Both knees feel ligamentously stable.  She will continue increase her activities as comfort allows.  She can take ibuprofen.  She can play golf from my standpoint.  I would like to see her back in 6 months with an AP and lateral of both knees.  All question concerns were answered and addressed.

## 2019-07-18 ENCOUNTER — Other Ambulatory Visit: Payer: Self-pay

## 2019-07-18 ENCOUNTER — Encounter: Payer: Self-pay | Admitting: Orthopaedic Surgery

## 2019-07-18 ENCOUNTER — Ambulatory Visit: Payer: Medicare PPO | Admitting: Orthopaedic Surgery

## 2019-07-18 ENCOUNTER — Ambulatory Visit (INDEPENDENT_AMBULATORY_CARE_PROVIDER_SITE_OTHER): Payer: Medicare PPO

## 2019-07-18 ENCOUNTER — Ambulatory Visit: Payer: Self-pay

## 2019-07-18 DIAGNOSIS — Z96652 Presence of left artificial knee joint: Secondary | ICD-10-CM

## 2019-07-18 DIAGNOSIS — Z96651 Presence of right artificial knee joint: Secondary | ICD-10-CM | POA: Diagnosis not present

## 2019-07-18 DIAGNOSIS — Z09 Encounter for follow-up examination after completed treatment for conditions other than malignant neoplasm: Secondary | ICD-10-CM | POA: Diagnosis not present

## 2019-07-18 NOTE — Progress Notes (Signed)
Office Visit Note   Patient: Lindsay Jimenez           Date of Birth: May 05, 1950           MRN: [id number] Visit Date: 07/18/2019              Requested by: No referring provider defined for this encounter. PCP: System, Pcp Not In   Assessment & Plan: Visit Diagnoses:  1. Status post total left knee replacement   2. Status post total right knee replacement     Plan: I had a long and thorough discussion with Lindsay Jimenez about her knees.  She is very active individual and I am fine with her staying as active as she is.  If she does develop any signs or symptoms of instability or swelling in her knees I have described these to her and would like her to come see [place] if she has any issues at all.  Otherwise, follow-up can be as needed.  All questions and concerns were answered and addressed.  Follow-Up Instructions: Return if symptoms worsen or fail to improve.   Orders:  Orders Placed This Encounter  Procedures  . XR Knee 1-2 Views Left  . XR Knee 1-2 Views Right   No orders of the defined types were placed in this encounter.     Procedures: No procedures performed   Clinical Data: No additional findings.   Subjective: Chief Complaint  Patient presents with  . Left Knee - Follow-up  . Right Knee - Follow-up  The patient is well-known to me.  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  We replaced her right knee in 2017 and her left knee in 2020.  She is doing well overall and has no issues.  She reports good motion and good strength.  She denies any swelling.  She has had no other acute changes in her medical status.  HPI  Review of Systems She currently denies any headache, chest pain, shortness of breath, fever, chills, nausea, vomiting  Objective: Vital Signs: There were no vitals taken for this visit.  Physical Exam She is alert and orient x3 and in no acute distress Ortho Exam Examination of both knees show that the  have full range of motion.  Neither knee has an effusion.  Both knees are ligamentously stable. Specialty Comments:  No specialty comments available.  Imaging: XR Knee 1-2 Views Left  Result Date: 07/18/2019 2 views of the left knee show well-seated total knee arthroplasty with no complicating features.  XR Knee 1-2 Views Right  Result Date: 07/18/2019 2 views of the right knee show well-seated total knee arthroplasty with no complicating features.    PMFS History: Patient Active Problem List   Diagnosis Date Noted  . Status post total left knee replacement 11/19/2018  . Unilateral primary osteoarthritis, left knee 08/21/2016  . Chronic pain of left knee 08/21/2016  . Osteoarthritis of right knee 03/02/2015  . Status post total right knee replacement 03/02/2015  . Osteoarthritis of right hip 10/20/2014  . Status post total replacement of right hip 10/20/2014   Past Medical History:  Diagnosis Date  . Arthritis    oa  . Breast CA ([employer]) 2002   bilateral  . Family history of adverse reaction to anesthesia    aunt had problem , not sure what problem  was  . GERD (gastroesophageal reflux disease)    occasional  . PONV (postoperative nausea and  vomiting)     History reviewed. No pertinent family history.  Past Surgical History:  Procedure Laterality Date  . ablation to both knees  april and may 2016   to deaden nerves  . KNEE SURGERY Right 1970   cartlidge removed  . MASTECTOMY Bilateral 2002  . OOPHORECTOMY Bilateral 2004  . TOTAL HIP ARTHROPLASTY Right 10/20/2014   Procedure: RIGHT TOTAL HIP ARTHROPLASTY ANTERIOR APPROACH;  Surgeon: Mcarthur Rossetti, MD;  Location: [facility];  Service: Orthopedics;  Laterality: Right;  . TOTAL KNEE ARTHROPLASTY Right 03/02/2015   Procedure: RIGHT TOTAL KNEE ARTHROPLASTY;  Surgeon: Mcarthur Rossetti, MD;  Location: [facility];  Service: Orthopedics;  Laterality: Right;  . TOTAL KNEE ARTHROPLASTY Left 11/19/2018   Procedure: LEFT TOTAL  KNEE ARTHROPLASTY;  Surgeon: Mcarthur Rossetti, MD;  Location: [facility];  Service: Orthopedics;  Laterality: Left;   Social History   Occupational History  . Not on file  Tobacco Use  . Smoking status: Never Smoker  . Smokeless tobacco: Never Used  Substance and Sexual Activity  . Alcohol use: No  . Drug use: No  . Sexual activity: Not on file

## 2019-07-25 ENCOUNTER — Ambulatory Visit: Payer: Medicare Other | Admitting: Orthopaedic Surgery

## 2020-02-11 HISTORY — PX: CATARACT EXTRACTION: SUR2

## 2020-03-19 ENCOUNTER — Ambulatory Visit (INDEPENDENT_AMBULATORY_CARE_PROVIDER_SITE_OTHER): Payer: Medicare PPO

## 2020-03-19 ENCOUNTER — Ambulatory Visit: Payer: Medicare PPO | Admitting: Orthopaedic Surgery

## 2020-03-19 DIAGNOSIS — Z96652 Presence of left artificial knee joint: Secondary | ICD-10-CM

## 2020-03-19 DIAGNOSIS — M25562 Pain in left knee: Secondary | ICD-10-CM

## 2020-03-19 NOTE — Progress Notes (Signed)
Office Visit Note   Patient: Lindsay Jimenez           Date of Birth: 1950/04/07           MRN: [id number] Visit Date: 03/19/2020              Requested by: No referring provider defined for this encounter. PCP: Pcp, No   Assessment & Plan: Visit Diagnoses:  1. Status post total left knee replacement   2. Acute pain of left knee     Plan: For now I am going to have her rest her knee and not on exercise walks.  She can use her exercise bike though.  She does take ibuprofen 400 mg twice a day, but I have her increase this to 600 mg twice a day for the next week.  Also wonder try some Voltaren gel over the medial aspect of her knee 2-3 times a day and I described this to her on how to apply it.  All question concerns were answered and addressed.  Follow-up can be as needed unless things worsen or do not get better.  Follow-Up Instructions: Return if symptoms worsen or fail to improve.   Orders:  Orders Placed This Encounter  Procedures  . XR Knee 1-2 Views Left   No orders of the defined types were placed in this encounter.     Procedures: No procedures performed   Clinical Data: No additional findings.   Subjective: Chief Complaint  Patient presents with  . Left Knee - Pain  The patient is a very active 70 year old female who has a history of bilateral knee replacements that were not done at the same time.  The left knee was replaced about a year and a half ago.  She has been having some left knee pain for about 2 to 3 weeks.  She also has history of a total hip arthroplasty.  She does do a lot of exercise walking at least 3 miles a day.  She does walk around a lot of hills.  She did have a recent fall when she twisted her right ankle recently.  She points to the medial epicondyle area of her left knee as a source of her pain.  She denies any swelling and denies any instability symptoms of that knee.  She is otherwise had no acute change in her medical  status.  HPI  Review of Systems She currently denies any headache, chest pain, shortness of breath, fever, chills, nausea, vomiting  Objective: Vital Signs: There were no vitals taken for this visit.  Physical Exam She is alert and orient x3 and in no acute distress Ortho Exam Examination of her left knee shows that it is ligamentously stable and has full range of motion.  There is no swelling or effusion.  There is some pain over the medial collateral ligament. Specialty Comments:  No specialty comments available.  Imaging: XR Knee 1-2 Views Left  Result Date: 03/19/2020 2 views of the left knee show well-seated total knee arthroplasty with no complicating features.    PMFS History: Patient Active Problem List   Diagnosis Date Noted  . Status post total left knee replacement 11/19/2018  . Unilateral primary osteoarthritis, left knee 08/21/2016  . Chronic pain of left knee 08/21/2016  . Osteoarthritis of right knee 03/02/2015  . Status post total right knee replacement 03/02/2015  . Osteoarthritis of right hip 10/20/2014  . Status post total replacement of right hip 10/20/2014   Past Medical  History:  Diagnosis Date  . Arthritis    oa  . Breast CA ([place]) 2002   bilateral  . Family history of adverse reaction to anesthesia    aunt had problem , not sure what problem  was  . GERD (gastroesophageal reflux disease)    occasional  . PONV (postoperative nausea and vomiting)     No family history on file.  Past Surgical History:  Procedure Laterality Date  . ablation to both knees  april and may 2016   to deaden nerves  . KNEE SURGERY Right 1970   cartlidge removed  . MASTECTOMY Bilateral 2002  . OOPHORECTOMY Bilateral 2004  . TOTAL HIP ARTHROPLASTY Right 10/20/2014   Procedure: RIGHT TOTAL HIP ARTHROPLASTY ANTERIOR APPROACH;  Surgeon: Mcarthur Rossetti, MD;  Location: [facility];  Service: Orthopedics;  Laterality: Right;  . TOTAL KNEE ARTHROPLASTY Right 03/02/2015    Procedure: RIGHT TOTAL KNEE ARTHROPLASTY;  Surgeon: Mcarthur Rossetti, MD;  Location: [facility];  Service: Orthopedics;  Laterality: Right;  . TOTAL KNEE ARTHROPLASTY Left 11/19/2018   Procedure: LEFT TOTAL KNEE ARTHROPLASTY;  Surgeon: Mcarthur Rossetti, MD;  Location: [facility];  Service: Orthopedics;  Laterality: Left;   Social History   Occupational History  . Not on file  Tobacco Use  . Smoking status: Never Smoker  . Smokeless tobacco: Never Used  Vaping Use  . Vaping Use: Never used  Substance and Sexual Activity  . Alcohol use: No  . Drug use: No  . Sexual activity: Not on file

## 2020-06-20 ENCOUNTER — Encounter: Payer: Self-pay | Admitting: Physician Assistant

## 2020-06-20 ENCOUNTER — Ambulatory Visit: Payer: Medicare PPO | Admitting: Physician Assistant

## 2020-06-20 ENCOUNTER — Ambulatory Visit (INDEPENDENT_AMBULATORY_CARE_PROVIDER_SITE_OTHER): Payer: Medicare PPO

## 2020-06-20 DIAGNOSIS — S76912A Strain of unspecified muscles, fascia and tendons at thigh level, left thigh, initial encounter: Secondary | ICD-10-CM | POA: Diagnosis not present

## 2020-06-20 MED ORDER — METHYLPREDNISOLONE 4 MG PO TABS: ORAL_TABLET | ORAL | 0 refills | Status: DC

## 2020-06-20 NOTE — Progress Notes (Signed)
Office Visit Note   Patient: Lindsay Jimenez           Date of Birth: 1950-03-09           MRN: [id number] Visit Date: 06/20/2020              Requested by: No referring provider defined for this encounter. PCP: Pcp, No   Assessment & Plan: Visit Diagnoses:  1. Muscle strain of left thigh, initial encounter     Plan: Activity administration recommend no golf no twisting.  She can work on gentle range of motion of the hip and flexion of the hip.  Place her on a Medrol Dosepak.  Continue the Robaxin which she has a refill on.  Recommend heat to the thigh area.  She has no improvement next 2 weeks she will follow-up with [place] otherwise follow-up as needed.  If her condition becomes worse she will follow-up sooner.  She is heading to [place] for a week this coming Friday.  Questions were encouraged and answered at length.  Follow-Up Instructions: Return in about 2 weeks (around 07/04/2020).   Orders:  Orders Placed This Encounter  Procedures  . XR HIP UNILAT W OR W/O PELVIS 2-3 VIEWS LEFT   Meds ordered this encounter  Medications  . methylPREDNISolone (MEDROL) 4 MG tablet    Sig: Take as directed    Dispense:  21 tablet    Refill:  0      Procedures: No procedures performed   Clinical Data: No additional findings.   Subjective: Chief Complaint  Patient presents with  . Left Hip - Pain    HPI Lindsay Jimenez is well-known to our department service comes in today with left hip pain.  Pain began 2 weeks ago after playing golf.  She felt a pull after doing swing.  Otherwise no injury.  States the pain overall is getting worse.  She has difficulty getting in and out of vehicles putting on her shoes and socks.  She points to the groin as the source of her pain.  She denies any numbness tingling down the leg.  She has tried Robaxin and ibuprofen she states her pain is better in the morning worse as the day goes by.  Denies any back pain.  Nondiabetic. Review of Systems Denies any fevers  or chills.  Objective: Vital Signs: There were no vitals taken for this visit.  Physical Exam General: Well-developed well-nourished female no acute distress.  Ambulates without any assistive device. Psych alert and oriented x3. Ortho Exam Left hip fluid motion with extremes of internal and external rotation causes pain groin region.  Tenderness over the abductor region of the inner thigh.  Some discomfort with abduction of the left leg across body.  Has trouble initiating flexion of the hip but is able to hold if the hip is brought passively up to flexion.  Tenderness over the quad muscles no tenderness over the VMO though.  Tenderness over the left IT band and left greater trochanteric region.  Right hip excellent range of motion without pain.  No abnormal warmth erythema ecchymosis or edema of either thigh.  Specialty Comments:  No specialty comments available.  Imaging: XR HIP UNILAT W OR W/O PELVIS 2-3 VIEWS LEFT  Result Date: 06/20/2020 AP pelvis lateral view left hip: No acute fractures.  Both hips are well located.  Status post right total hip arthroplasty with well-seated components.  Left hip appears well-preserved.    PMFS History: Patient Active Problem List  Diagnosis Date Noted  . Status post total left knee replacement 11/19/2018  . Unilateral primary osteoarthritis, left knee 08/21/2016  . Chronic pain of left knee 08/21/2016  . Osteoarthritis of right knee 03/02/2015  . Status post total right knee replacement 03/02/2015  . Osteoarthritis of right hip 10/20/2014  . Status post total replacement of right hip 10/20/2014   Past Medical History:  Diagnosis Date  . Arthritis    oa  . Breast CA ([place]) 2002   bilateral  . Family history of adverse reaction to anesthesia    aunt had problem , not sure what problem  was  . GERD (gastroesophageal reflux disease)    occasional  . PONV (postoperative nausea and vomiting)     History reviewed. No pertinent family  history.  Past Surgical History:  Procedure Laterality Date  . ablation to both knees  april and may 2016   to deaden nerves  . KNEE SURGERY Right 1970   cartlidge removed  . MASTECTOMY Bilateral 2002  . OOPHORECTOMY Bilateral 2004  . TOTAL HIP ARTHROPLASTY Right 10/20/2014   Procedure: RIGHT TOTAL HIP ARTHROPLASTY ANTERIOR APPROACH;  Surgeon: Mcarthur Rossetti, MD;  Location: [facility];  Service: Orthopedics;  Laterality: Right;  . TOTAL KNEE ARTHROPLASTY Right 03/02/2015   Procedure: RIGHT TOTAL KNEE ARTHROPLASTY;  Surgeon: Mcarthur Rossetti, MD;  Location: [facility];  Service: Orthopedics;  Laterality: Right;  . TOTAL KNEE ARTHROPLASTY Left 11/19/2018   Procedure: LEFT TOTAL KNEE ARTHROPLASTY;  Surgeon: Mcarthur Rossetti, MD;  Location: [facility];  Service: Orthopedics;  Laterality: Left;   Social History   Occupational History  . Not on file  Tobacco Use  . Smoking status: Never Smoker  . Smokeless tobacco: Never Used  Vaping Use  . Vaping Use: Never used  Substance and Sexual Activity  . Alcohol use: No  . Drug use: No  . Sexual activity: Not on file

## 2020-07-04 ENCOUNTER — Encounter: Payer: Self-pay | Admitting: Physician Assistant

## 2020-07-04 ENCOUNTER — Ambulatory Visit: Payer: Medicare PPO | Admitting: Physician Assistant

## 2020-07-04 DIAGNOSIS — S76912A Strain of unspecified muscles, fascia and tendons at thigh level, left thigh, initial encounter: Secondary | ICD-10-CM

## 2020-07-04 NOTE — Progress Notes (Signed)
HPI: [name] returns today 2 weeks status post left thigh strain.  She states she is 80% better.  She did take the Dosepak and Robaxin.  She states she still has pain with certain movements especially in extreme internal or external rotation of the left hip.  She is also unable to return to playing golf due to the pain mostly in her thigh and in the lateral aspect of her hip.  She is having no radicular symptoms down the leg.   Review of systems see HPI otherwise negative or noncontributory.  Physical exam: General well-developed well-nourished female no acute distress ambulates without any assistive device. Left hip good range of motion of the left hip internal and external rotation of the hip causes some pain lateral aspect pain and the inner thigh.  She is no longer tender over her quad muscles.  Slight tenderness over left trochanteric region and over the inner groin area.  There is no ecchymosis or erythema.  Impression: Left thigh strain  Plan: We will send her to formal physical therapy for stretching, modalities and strengthening of the left thigh and hip.  She will follow-up with [place] on an as-needed basis.  She continues to have pain or if it becomes worse.  Questions encouraged and answered

## 2021-01-03 IMAGING — DX DG KNEE 1-2V PORT*L*
2 series · 2 of 2 positions shown · non-contrast
Comparison: Radiographs dated 10/13/2018

CLINICAL DATA: Osteoarthritis of the left knee. Status post total
knee arthroplasty.

EXAM:
PORTABLE LEFT KNEE - 1-2 VIEW

[knee ap]
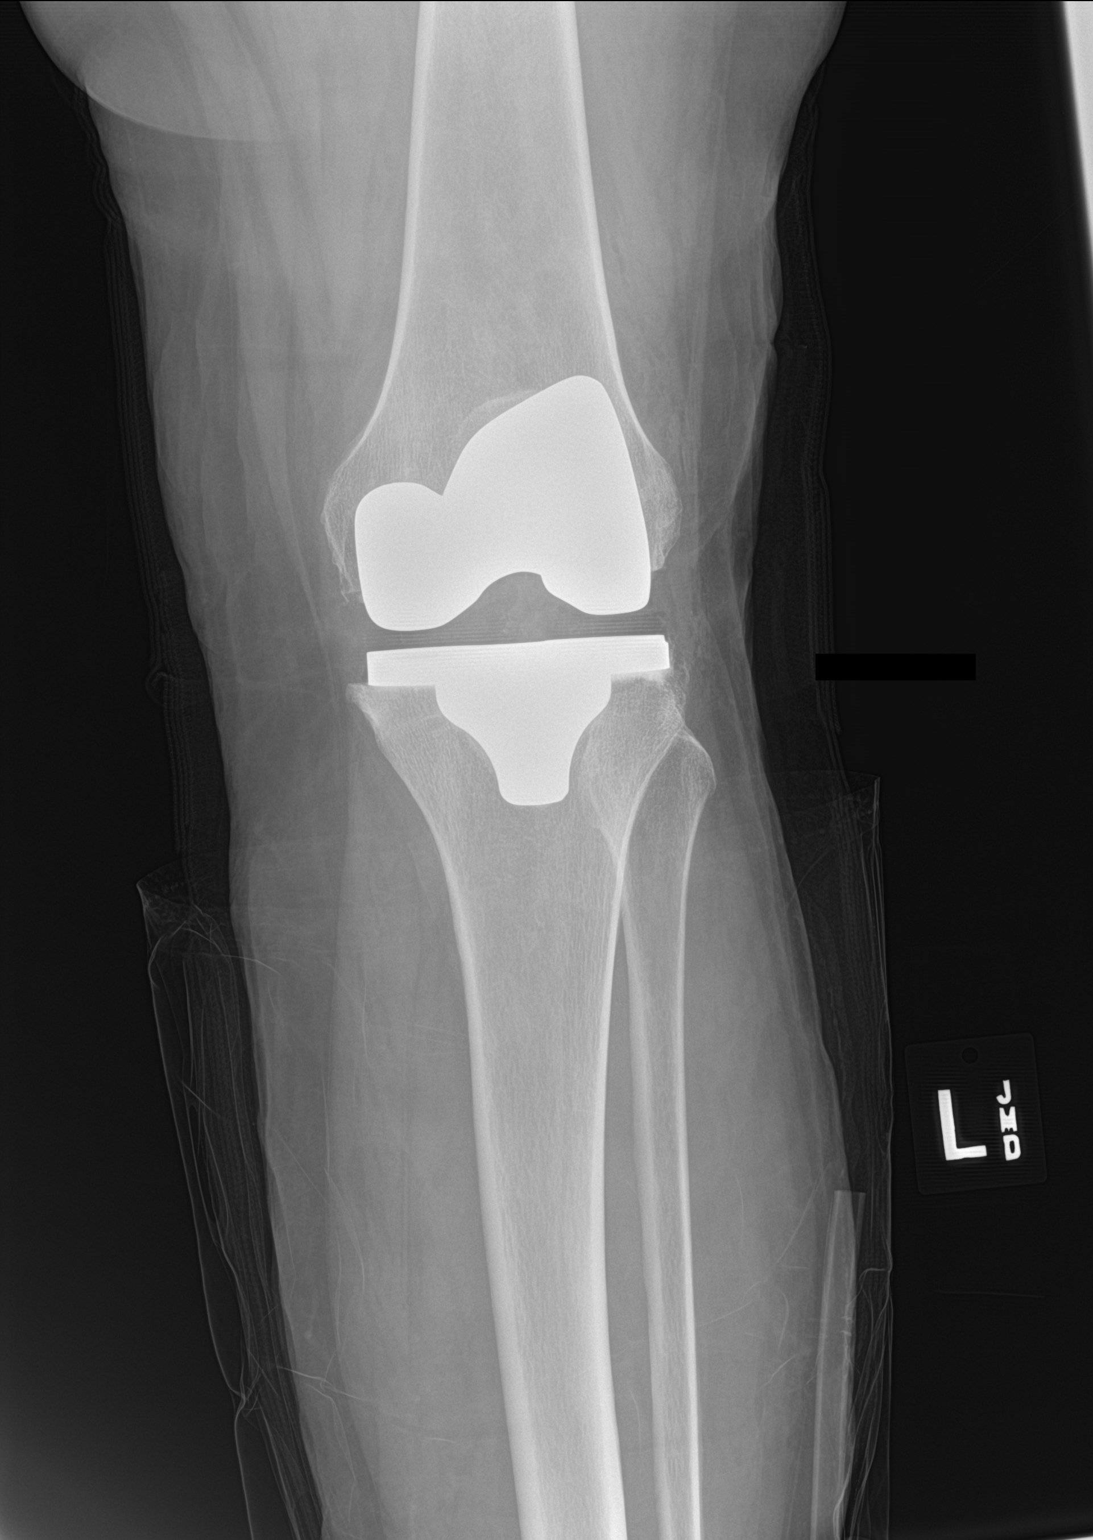

[knee lat]
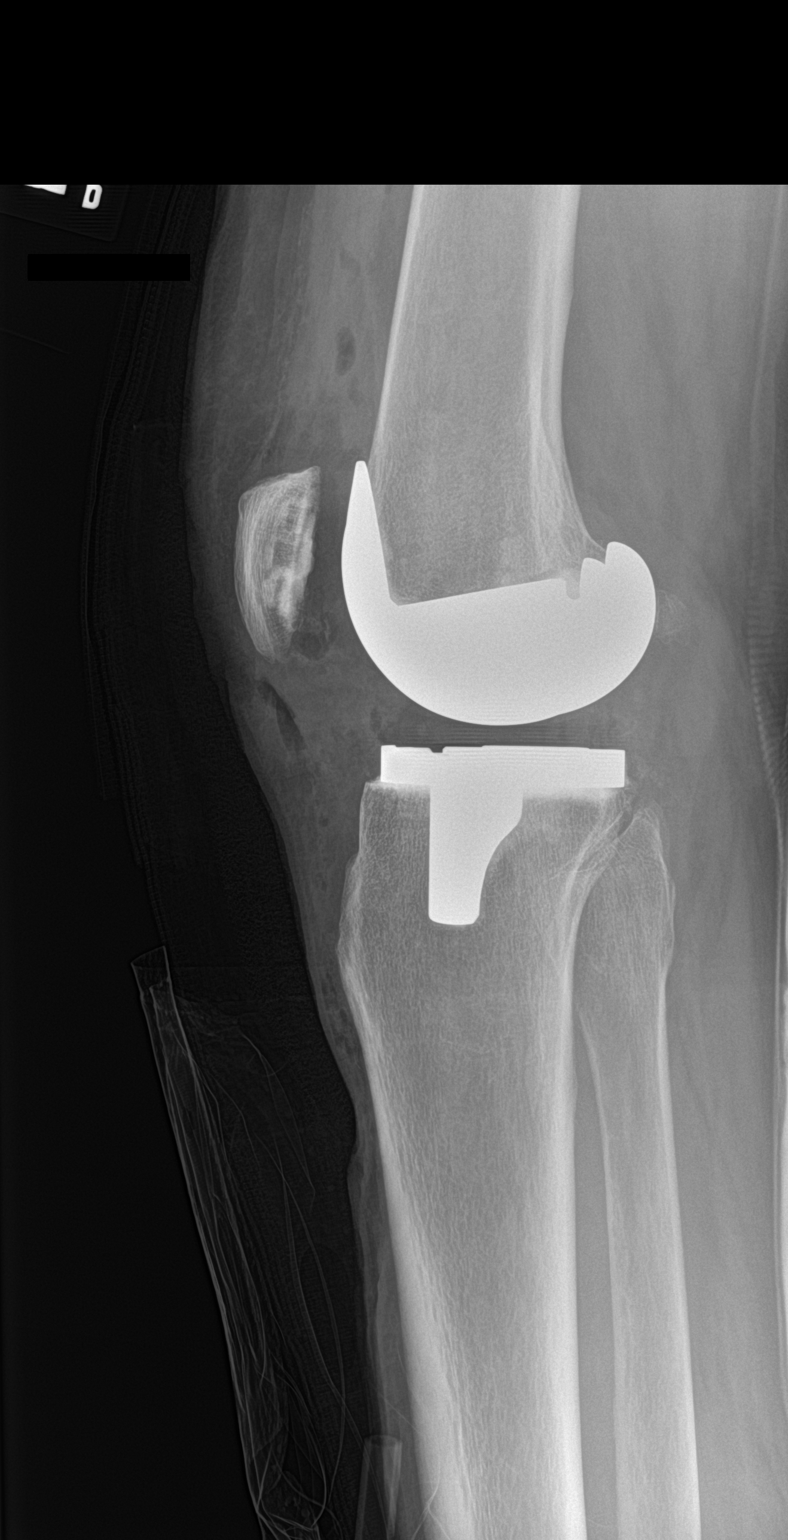

[2 of 2 positions shown; findings below may reference images not displayed]

FINDINGS: The components of the left total knee prosthesis appear in excellent
position. No fractures. Expected postsurgical air in the soft
tissues.
IMPRESSION: Satisfactory appearance of the left knee after total knee
replacement.

## 2021-04-03 ENCOUNTER — Ambulatory Visit: Payer: Self-pay

## 2021-04-03 ENCOUNTER — Other Ambulatory Visit: Payer: Self-pay

## 2021-04-03 ENCOUNTER — Ambulatory Visit: Payer: Medicare PPO | Admitting: Orthopaedic Surgery

## 2021-04-03 ENCOUNTER — Ambulatory Visit (INDEPENDENT_AMBULATORY_CARE_PROVIDER_SITE_OTHER): Payer: Medicare PPO

## 2021-04-03 ENCOUNTER — Encounter: Payer: Self-pay | Admitting: Orthopaedic Surgery

## 2021-04-03 DIAGNOSIS — M19012 Primary osteoarthritis, left shoulder: Secondary | ICD-10-CM | POA: Diagnosis not present

## 2021-04-03 DIAGNOSIS — M25511 Pain in right shoulder: Secondary | ICD-10-CM | POA: Diagnosis not present

## 2021-04-03 DIAGNOSIS — M19011 Primary osteoarthritis, right shoulder: Secondary | ICD-10-CM

## 2021-04-03 DIAGNOSIS — M25512 Pain in left shoulder: Secondary | ICD-10-CM

## 2021-04-03 NOTE — Progress Notes (Signed)
Office Visit Note   Patient: Lindsay Jimenez           Date of Birth: February 01, 1951           MRN: [id number] Visit Date: 04/03/2021              Requested by: No referring provider defined for this encounter. PCP: Pcp, No   Assessment & Plan: Visit Diagnoses:  1. Primary osteoarthritis of left shoulder   2. Primary osteoarthritis of right shoulder     Plan: Recommend formal therapy for range of motion both shoulders strengthening.  Regards to the shoulder arthritis discussed conservative treatment as a first option and she is agreeable with this.  We will try that therapy and then also tried intra-articular injections both shoulders.  We will have her follow-up with Dr. Marlou Jimenez in approximately 8 weeks to see what type of response she had to the intra-articular injections and also so she can become established with Dr. Marlou Jimenez for shoulders.  Questions were encouraged and answered at length  Follow-Up Instructions: Return Dr. Marlou Jimenez 8 weeks.   Orders:  Orders Placed This Encounter  Procedures   XR Shoulder Right   XR Shoulder Left   No orders of the defined types were placed in this encounter.     Procedures: No procedures performed   Clinical Data: No additional findings.   Subjective: Chief Complaint  Patient presents with   Right Shoulder - Pain   Left Shoulder - Pain    HPI Lindsay Jimenez 71 year old female well-known to Dr. Ninfa Jimenez service comes in today with bilateral shoulder pain for the past 2 to 3 months.  States her right shoulder for started bothering her and now she is having pain in both shoulders.  She notes decreased range of motion both shoulders she still plays softball notes that she has difficulty throwing a ball with the right arm.  She denies any numbness tingling down either arm.  Pain is 10 out of 10 at worst in both shoulders.  She has tried ibuprofen Tylenol and CBD ointment which helps some.  She is having no neck pain.  Patient is nondiabetic.  Review of  Systems  Constitutional:  Negative for chills and fever.    Objective: Vital Signs: There were no vitals taken for this visit.  Physical Exam Constitutional:      Appearance: She is not ill-appearing or diaphoretic.  Pulmonary:     Effort: Pulmonary effort is normal.  Neurological:     Mental Status: She is alert and oriented to person, place, and time.  Psychiatric:        Mood and Affect: Mood normal.    Ortho Exam Bilateral shoulders 5 out of 5 strength with external and internal rotation against resistance empty can test is negative bilaterally.  Liftoff test negative bilaterally.  Diminished external rotation both shoulders.  Forward flexion passively I can bring her right shoulder to approximately 140 degrees left shoulder to 170 degrees. Specialty Comments:  No specialty comments available.  Imaging: XR Shoulder Left  Result Date: 04/03/2021 Left shoulder: Shoulder is well located.  Non high  riding shoulder.  No acute fractures.  Axillary view shows bone-on-bone glenohumeral joint.  No other bony abnormalities.  XR Shoulder Right  Result Date: 04/03/2021 Right shoulder 3 views: No acute fractures.  Shoulder is well located.  Periarticular spurring off the humeral head with end-stage arthritis.  Shoulders not high riding.    PMFS History: Patient Active Problem List  Diagnosis Date Noted   Status post total left knee replacement 11/19/2018   Unilateral primary osteoarthritis, left knee 08/21/2016   Chronic pain of left knee 08/21/2016   Osteoarthritis of right knee 03/02/2015   Status post total right knee replacement 03/02/2015   Osteoarthritis of right hip 10/20/2014   Status post total replacement of right hip 10/20/2014   Past Medical History:  Diagnosis Date   Arthritis    oa   Breast CA ([place]) 2002   bilateral   Family history of adverse reaction to anesthesia    aunt had problem , not sure what problem  was   GERD (gastroesophageal reflux disease)     occasional   PONV (postoperative nausea and vomiting)     History reviewed. No pertinent family history.  Past Surgical History:  Procedure Laterality Date   ablation to both knees  april and may 2016   to deaden nerves   KNEE SURGERY Right 1970   cartlidge removed   MASTECTOMY Bilateral 2002   OOPHORECTOMY Bilateral 2004   TOTAL HIP ARTHROPLASTY Right 10/20/2014   Procedure: RIGHT TOTAL HIP ARTHROPLASTY ANTERIOR APPROACH;  Surgeon: Mcarthur Rossetti, MD;  Location: [facility];  Service: Orthopedics;  Laterality: Right;   TOTAL KNEE ARTHROPLASTY Right 03/02/2015   Procedure: RIGHT TOTAL KNEE ARTHROPLASTY;  Surgeon: Mcarthur Rossetti, MD;  Location: [facility];  Service: Orthopedics;  Laterality: Right;   TOTAL KNEE ARTHROPLASTY Left 11/19/2018   Procedure: LEFT TOTAL KNEE ARTHROPLASTY;  Surgeon: Mcarthur Rossetti, MD;  Location: [facility];  Service: Orthopedics;  Laterality: Left;   Social History   Occupational History   Not on file  Tobacco Use   Smoking status: Never   Smokeless tobacco: Never  Vaping Use   Vaping Use: Never used  Substance and Sexual Activity   Alcohol use: No   Drug use: No   Sexual activity: Not on file

## 2021-04-03 NOTE — Addendum Note (Signed)
Addended by: Robyne Peers on: 04/03/2021 11:03 AM   Modules accepted: Orders

## 2021-04-22 ENCOUNTER — Other Ambulatory Visit: Payer: Self-pay

## 2021-04-22 ENCOUNTER — Encounter: Payer: Self-pay | Admitting: Physical Medicine and Rehabilitation

## 2021-04-22 ENCOUNTER — Ambulatory Visit (INDEPENDENT_AMBULATORY_CARE_PROVIDER_SITE_OTHER): Payer: Medicare PPO | Admitting: Physical Medicine and Rehabilitation

## 2021-04-22 ENCOUNTER — Ambulatory Visit: Payer: Self-pay

## 2021-04-22 DIAGNOSIS — M25512 Pain in left shoulder: Secondary | ICD-10-CM

## 2021-04-22 DIAGNOSIS — G8929 Other chronic pain: Secondary | ICD-10-CM

## 2021-04-22 DIAGNOSIS — M25511 Pain in right shoulder: Secondary | ICD-10-CM

## 2021-04-22 NOTE — Progress Notes (Signed)
Pt state both shoulder pain. Pt state any movement with her arms makes the pain worse. Pt state she takes over the counter pain meds to help ease her pain. ? ?Numeric Pain Rating Scale and Functional Assessment ?Average Pain 7 ? ? ?In the last MONTH (on 0-10 scale) has pain interfered with the following? ? ?1. General activity like being  able to carry out your everyday physical activities such as walking, climbing stairs, carrying groceries, or moving a chair?  ?Rating(9) ? ?-BT, -Dye Allergies. ? ?

## 2021-04-27 MED ORDER — BUPIVACAINE HCL 0.25 % IJ SOLN
5.0000 mL | INTRAMUSCULAR | Status: AC | PRN
  Administered 2021-04-22: 5 mL via INTRA_ARTICULAR

## 2021-04-27 MED ORDER — TRIAMCINOLONE ACETONIDE 40 MG/ML IJ SUSP
40.0000 mg | INTRAMUSCULAR | Status: AC | PRN
  Administered 2021-04-22: 40 mg via INTRA_ARTICULAR

## 2021-04-27 NOTE — Progress Notes (Signed)
? ?  Lindsay Jimenez - 71 y.o. female MRN [id number]  Date of birth: [birth year] ? ?Office Visit Note: ?Visit Date: 04/22/2021 ?PCP: Pcp, No ?Referred by: Pete Pelt, PA-C ? ?Subjective: ?Chief Complaint  ?Patient presents with  ? Right Shoulder - Pain  ? Left Shoulder - Pain  ? ?HPI:  Lindsay Jimenez is a 71 y.o. female who comes in today at the request of Dr. Jean Rosenthal for planned Bilateral anesthetic glenohumeral arthrogram with fluoroscopic guidance.  The patient has failed conservative care including home exercise, medications, time and activity modification.  This injection will be diagnostic and hopefully therapeutic.  Please see requesting physician notes for further details and justification.  ? ?ROS Otherwise per HPI. ? ?Assessment & Plan: ?Visit Diagnoses:  ?  ICD-10-CM   ?1. Chronic pain of both shoulders  M25.511 XR C-ARM NO REPORT  ? G89.29   ? M25.512   ?  ?  ?Plan: No additional findings.  ? ?Meds & Orders: No orders of the defined types were placed in this encounter. ?  ?Orders Placed This Encounter  ?Procedures  ? Large Joint Inj  ? XR C-ARM NO REPORT  ?  ?Follow-up: Return if symptoms worsen or fail to improve.  ? ?Procedures: ?Large Joint Inj: bilateral glenohumeral on 04/22/2021 1:45 PM ?Indications: pain and diagnostic evaluation ?Details: 22 G 3.5 in needle, fluoroscopy-guided anteromedial approach ? ?Arthrogram: No ? ?Medications (Right): 40 mg triamcinolone acetonide 40 MG/ML; 5 mL bupivacaine 0.25 % ?Medications (Left): 40 mg triamcinolone acetonide 40 MG/ML; 5 mL bupivacaine 0.25 % ?Outcome: tolerated well, no immediate complications ? ?There was excellent flow of contrast producing a partial arthrogram of the glenohumeral joint. The patient did have relief of symptoms during the anesthetic phase of the injection. ?Procedure, treatment alternatives, risks and benefits explained, specific risks discussed. Consent was given by the patient. Immediately prior to procedure a time out  was called to verify the correct patient, procedure, equipment, support staff and site/side marked as required. Patient was prepped and draped in the usual sterile fashion.  ? ?  ?   ? ?Clinical History: ?No specialty comments available.  ? ? ? ?Objective:  VS:  HT:    WT:   BMI:     BP:   HR: bpm  TEMP: ( )  RESP:  ?Physical Exam  ? ?Imaging: ?No results found. ?

## 2021-05-29 ENCOUNTER — Ambulatory Visit: Payer: Medicare PPO | Admitting: Orthopedic Surgery

## 2021-05-29 DIAGNOSIS — M19012 Primary osteoarthritis, left shoulder: Secondary | ICD-10-CM

## 2021-05-29 DIAGNOSIS — M19011 Primary osteoarthritis, right shoulder: Secondary | ICD-10-CM

## 2021-05-30 ENCOUNTER — Encounter: Payer: Self-pay | Admitting: Orthopedic Surgery

## 2021-05-30 NOTE — Progress Notes (Signed)
? ?Office Visit Note ?  ?Patient: Lindsay Jimenez           ?Date of Birth: Nov 16, 1950           ?MRN: [id number] ?Visit Date: 05/29/2021 ?Requested by: No referring provider defined for this encounter. ?PCP: Pcp, No ? ?Subjective: ?Chief Complaint  ?Patient presents with  ?? Shoulder Pain  ? ? ?HPI: Patient is a 71 year old female who presents complaining of bilateral shoulder pain.  Patient states that she began to notice shoulder pain last summer and describes mostly lateral shoulder pain without radiation.  She has no history of prior surgery to either shoulder or any history of injury.  She is very active and enjoys playing softball and golf.  She has seen Dr. Ninfa Linden and Benita Stabile, PA-C who diagnosed bilateral shoulder arthritis.  She has been going to physical therapy 2 times per week [place] focusing on stretching and strengthening exercises and has had bilateral shoulder injections with Dr. Ernestina Patches about a month ago that has provided 95% relief of her symptoms.  Takes ibuprofen with relief as well.  No history of prior dislocation.  Denies any history of diabetes or smoking.  She does have history of double mastectomy due to breast cancer in 2001. ?             ?ROS: All systems reviewed are negative as they relate to the chief complaint within the history of present illness.  Patient denies  fevers or chills. ? ? ?Assessment & Plan: ?Visit Diagnoses:  ?1. Primary osteoarthritis of left shoulder   ?2. Primary osteoarthritis of right shoulder   ? ? ?Plan: Patient is a 71 year old female who presents for evaluation of bilateral shoulder pain.  She has history of bilateral shoulder arthritis.  She has been seeing Dr. Ninfa Linden and has had 2 knee replacements and one hip replacement by him.  She has done well with these surgeries with no complications.  She recently had bilateral shoulder injections by Dr. Ernestina Patches which has provided excellent 95% relief of her symptoms.  She is not interested in surgery at this  time and recommended she delay surgery until symptoms progress and she is not able to control her pain with conservative measures.  She is planning on going to [place] in October and would like to have injections prior to this trip.  Recommend she return in August for planned shots in September. ? ?Follow-Up Instructions: Return if symptoms worsen or fail to improve.  ? ?Orders:  ?No orders of the defined types were placed in this encounter. ? ?No orders of the defined types were placed in this encounter. ? ? ? ? Procedures: ?No procedures performed ? ? ?Clinical Data: ?No additional findings. ? ?Objective: ?Vital Signs: There were no vitals taken for this visit. ? ?Physical Exam:  ? ?Constitutional: Patient appears well-developed ?HEENT:  ?Head: Normocephalic ?Eyes:EOM are normal ?Neck: Normal range of motion ?Cardiovascular: Normal rate ?Pulmonary/chest: Effort normal ?Neurologic: Patient is alert ?Skin: Skin is warm ?Psychiatric: Patient has normal mood and affect ? ? ?Ortho Exam: Ortho exam demonstrates left shoulder with 40 degrees external rotation, 50 degrees abduction, 130 degrees forward flexion.  This compared with the right shoulder to 40 degrees external rotation, 50 degrees abduction, 120 degrees forward flexion.  Excellent rotator cuff strength of supraspinatus, infraspinatus, subscapularis of the right shoulder.  Excellent cuff strength of supraspinatus and subscapularis of the left shoulder but there is 4/5 weakness of the infraspinatus on the left.  No  incisions noted over the bilateral shoulders.  Active range of motion equivalent to passive range of motion.  No tenderness over the [facility] joints bilaterally.  5/5 motor strength of bilateral grip strength, finger abduction, pronation/supination, bicep, tricep, deltoid.  No pain with cervical spine range of motion.  Negative Spurling sign. ? ?Specialty Comments:  ?No specialty comments available. ? ?Imaging: ?No results found. ? ? ?PMFS  History: ?Patient Active Problem List  ? Diagnosis Date Noted  ?? 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 Unilateral primary osteoarthritis, left knee 08/21/2016  ?? Chronic pain of left knee 08/21/2016  ?? Osteoarthritis of right knee 03/02/2015  ?? Status post total right knee replacement 03/02/2015  ?? Osteoarthritis of right hip 10/20/2014  ?? Status post total replacement of right hip 10/20/2014  ? ?Past Medical History:  ?Diagnosis Date  ?? Arthritis   ? oa  ?? Breast CA ([place]) 2002  ? bilateral  ?? Family history of adverse reaction to anesthesia   ? aunt had problem , not sure what problem  was  ?? GERD (gastroesophageal reflux disease)   ? occasional  ?? PONV (postoperative nausea and vomiting)   ?  ?History reviewed. No pertinent family history.  ?Past Surgical History:  ?Procedure Laterality Date  ?? ablation to both knees  april and may 2016  ? to deaden nerves  ?? KNEE SURGERY Right 1970  ? cartlidge removed  ?? MASTECTOMY Bilateral 2002  ?? OOPHORECTOMY Bilateral 2004  ?? TOTAL HIP ARTHROPLASTY Right 10/20/2014  ? Procedure: RIGHT TOTAL HIP ARTHROPLASTY ANTERIOR APPROACH;  Surgeon: Mcarthur Rossetti, MD;  Location: [facility];  Service: Orthopedics;  Laterality: Right;  ?? TOTAL KNEE ARTHROPLASTY Right 03/02/2015  ? Procedure: RIGHT TOTAL KNEE ARTHROPLASTY;  Surgeon: Mcarthur Rossetti, MD;  Location: [facility];  Service: Orthopedics;  Laterality: Right;  ?? TOTAL KNEE ARTHROPLASTY Left 11/19/2018  ? Procedure: LEFT TOTAL KNEE ARTHROPLASTY;  Surgeon: Mcarthur Rossetti, MD;  Location: [facility];  Service: Orthopedics;  Laterality: Left;  ? ?Social History  ? ?Occupational History  ?? Not on file  ?Tobacco Use  ?? Smoking status: Never  ?? Smokeless tobacco: Never  ?Vaping Use  ?? Vaping Use: Never used  ?Substance and Sexual Activity  ?? Alcohol use: No  ?? Drug use: No  ?? Sexual activity: Not on file  ? ? ? ? ? ?

## 2021-05-31 ENCOUNTER — Encounter: Payer: Self-pay | Admitting: Orthopedic Surgery

## 2021-09-23 ENCOUNTER — Ambulatory Visit: Payer: Medicare PPO | Admitting: Orthopedic Surgery

## 2021-09-23 ENCOUNTER — Encounter: Payer: Self-pay | Admitting: Orthopedic Surgery

## 2021-09-23 ENCOUNTER — Telehealth: Payer: Self-pay

## 2021-09-23 DIAGNOSIS — M19012 Primary osteoarthritis, left shoulder: Secondary | ICD-10-CM

## 2021-09-23 DIAGNOSIS — M19011 Primary osteoarthritis, right shoulder: Secondary | ICD-10-CM

## 2021-09-23 NOTE — Progress Notes (Signed)
Office Visit Note   Patient: Lindsay Jimenez           Date of Birth: [birth year]           MRN: [id number] Visit Date: 09/23/2021 Requested by: No referring provider defined for this encounter. PCP: Pcp, No  Subjective: Chief Complaint  Patient presents with   Right Shoulder - Pain   Left Shoulder - Pain    HPI: [name] is a 71 year old patient with bilateral shoulder arthritis.  Here to schedule some shoulder injections.  She is going to [place] the first week in October.  Still having a lot of pain.  After her last injections in April she had more left full painless range of motion for about 2 months.  She would like to reproduce that prior to her trip to [place].  Not really ready yet for shoulder replacement.              ROS: All systems reviewed are negative as they relate to the chief complaint within the history of present illness.  Patient denies  fevers or chills.   Assessment & Plan: Visit Diagnoses:  1. Primary osteoarthritis of left shoulder   2. Primary osteoarthritis of right shoulder     Plan: Impression is bilateral shoulder arthritis.  Taking ibuprofen and Tylenol.  Having pain with diminished range of motion and some loss of strength as well but in general she is able to tolerate her activities of daily living.  Plan is right shoulder injection with Dr. Ernestina Patches around September 20 which is her more symptomatic shoulder.  We will see her back 4 days after that for left shoulder injection under ultrasound.  She did have a bit of a rash when she had both shoulders injected with Dr. Ernestina Patches in April and therefore I want to try to delay them with a several days in between injections.  Follow-Up Instructions: Return in about 5 weeks (around 10/28/2021).   Orders:  No orders of the defined types were placed in this encounter.  No orders of the defined types were placed in this encounter.     Procedures: No procedures performed   Clinical Data: No additional  findings.  Objective: Vital Signs: There were no vitals taken for this visit.  Physical Exam:   Constitutional: Patient appears well-developed HEENT:  Head: Normocephalic Eyes:EOM are normal Neck: Normal range of motion Cardiovascular: Normal rate Pulmonary/chest: Effort normal Neurologic: Patient is alert Skin: Skin is warm Psychiatric: Patient has normal mood and affect   Ortho Exam: Ortho exam demonstrates passive range of motion on the right of 60/70/95.  On the left passive range of motion is 65/95/125.  Rotator cuff strength is 5+ out of 5 on the right to infraspinatus testing 5-5 on the left infraspinatus testing.  Subscap supraspinatus strength 5+ out of 5 bilaterally.  Deltoid is functional.  Some coarse grinding and crepitus is present.  No masses lymphadenopathy or skin changes noted in the shoulder region.  Plan at this time is scheduled injections in September prior to her trip to [place]  Specialty Comments:  No specialty comments available.  Imaging: No results found.   PMFS History: Patient Active Problem List   Diagnosis Date Noted   Status post total left knee replacement 11/19/2018   Unilateral primary osteoarthritis, left knee 08/21/2016   Chronic pain of left knee 08/21/2016   Osteoarthritis of right knee 03/02/2015   Status post total right knee replacement 03/02/2015   Osteoarthritis  of right hip 10/20/2014   Status post total replacement of right hip 10/20/2014   Past Medical History:  Diagnosis Date   Arthritis    oa   Breast CA ([place]) 2002   bilateral   Family history of adverse reaction to anesthesia    aunt had problem , not sure what problem  was   GERD (gastroesophageal reflux disease)    occasional   PONV (postoperative nausea and vomiting)     History reviewed. No pertinent family history.  Past Surgical History:  Procedure Laterality Date   ablation to both knees  april and may 2016   to deaden nerves   KNEE SURGERY Right  1970   cartlidge removed   MASTECTOMY Bilateral 2002   OOPHORECTOMY Bilateral 2004   TOTAL HIP ARTHROPLASTY Right 10/20/2014   Procedure: RIGHT TOTAL HIP ARTHROPLASTY ANTERIOR APPROACH;  Surgeon: Mcarthur Rossetti, MD;  Location: [facility];  Service: Orthopedics;  Laterality: Right;   TOTAL KNEE ARTHROPLASTY Right 03/02/2015   Procedure: RIGHT TOTAL KNEE ARTHROPLASTY;  Surgeon: Mcarthur Rossetti, MD;  Location: [facility];  Service: Orthopedics;  Laterality: Right;   TOTAL KNEE ARTHROPLASTY Left 11/19/2018   Procedure: LEFT TOTAL KNEE ARTHROPLASTY;  Surgeon: Mcarthur Rossetti, MD;  Location: [facility];  Service: Orthopedics;  Laterality: Left;   Social History   Occupational History   Not on file  Tobacco Use   Smoking status: Never   Smokeless tobacco: Never  Vaping Use   Vaping Use: Never used  Substance and Sexual Activity   Alcohol use: No   Drug use: No   Sexual activity: Not on file

## 2021-09-23 NOTE — Telephone Encounter (Signed)
Patient seen by Dr Marlou Sa today for follow up on bilateral shoulder pain.  Previously had injections with Dr Ernestina Patches with good relief. Patient is scheduled for a trip to [place] in October and Marlou Sa would like patient to have a shoulder injection around 10/30/21.  Ok to schedule for right shoulder injection?  Dr Marlou Sa will inject left shoulder with ultrasound a few days after.

## 2021-09-24 NOTE — Telephone Encounter (Signed)
Both appt scheduled and patient notified.

## 2021-10-30 ENCOUNTER — Ambulatory Visit (INDEPENDENT_AMBULATORY_CARE_PROVIDER_SITE_OTHER): Payer: Medicare PPO | Admitting: Physical Medicine and Rehabilitation

## 2021-10-30 ENCOUNTER — Encounter: Payer: Self-pay | Admitting: Physical Medicine and Rehabilitation

## 2021-10-30 ENCOUNTER — Ambulatory Visit: Payer: Self-pay

## 2021-10-30 DIAGNOSIS — M25511 Pain in right shoulder: Secondary | ICD-10-CM | POA: Diagnosis not present

## 2021-10-30 DIAGNOSIS — G8929 Other chronic pain: Secondary | ICD-10-CM | POA: Diagnosis not present

## 2021-10-30 NOTE — Progress Notes (Signed)
   SURAH PELLEY - 71 y.o. female MRN [id number]  Date of birth: 20-Jan-1951  Office Visit Note: Visit Date: 10/30/2021 PCP: Pcp, No Referred by: No ref. provider found  Subjective: Chief Complaint  Patient presents with   Right Shoulder - Pain   HPI:  CARIDAD SILVEIRA is a 71 y.o. female who comes in today at the request of Dr. Anderson Malta for planned Right anesthetic glenohumeral arthrogram with fluoroscopic guidance.  The patient has failed conservative care including home exercise, medications, time and activity modification.  This injection will be diagnostic and hopefully therapeutic.  Please see requesting physician notes for further details and justification.   ROS Otherwise per HPI.  Assessment & Plan: Visit Diagnoses:    ICD-10-CM   1. Chronic right shoulder pain  M25.511 Large Joint Inj: R glenohumeral   G89.29 XR C-ARM NO REPORT      Plan: No additional findings.   Meds & Orders: No orders of the defined types were placed in this encounter.   Orders Placed This Encounter  Procedures   Large Joint Inj: R glenohumeral   XR C-ARM NO REPORT    Follow-up: No follow-ups on file.   Procedures: Large Joint Inj: R glenohumeral on 10/30/2021 1:13 PM Indications: pain and diagnostic evaluation Details: 22 G 3.5 in needle, fluoroscopy-guided anteromedial approach  Arthrogram: No  Medications: 40 mg triamcinolone acetonide 40 MG/ML; 5 mL bupivacaine 0.25 % Outcome: tolerated well, no immediate complications  There was excellent flow of contrast producing a partial arthrogram of the glenohumeral joint. The patient did have relief of symptoms during the anesthetic phase of the injection. Procedure, treatment alternatives, risks and benefits explained, specific risks discussed. Consent was given by the patient. Immediately prior to procedure a time out was called to verify the correct patient, procedure, equipment, support staff and site/side marked as required. Patient was  prepped and draped in the usual sterile fashion.          Clinical History: No specialty comments available.     Objective:  VS:  HT:    WT:   BMI:     BP:   HR: bpm  TEMP: ( )  RESP:  Physical Exam   Imaging: No results found.

## 2021-10-30 NOTE — Progress Notes (Signed)
Numeric Pain Rating Scale and Functional Assessment Average Pain 5   In the last MONTH (on 0-10 scale) has pain interfered with the following?  1. General activity like being  able to carry out your everyday physical activities such as walking, climbing stairs, carrying groceries, or moving a chair?  Rating(5)   +Driver, -BT, -Dye Allergies.   States that she can't reach or throw a ball with the right arm/shoulder. States that last injection did help.  Right shoulder is the worst.

## 2021-11-04 ENCOUNTER — Encounter: Payer: Self-pay | Admitting: Surgical

## 2021-11-04 ENCOUNTER — Ambulatory Visit: Payer: Self-pay

## 2021-11-04 ENCOUNTER — Ambulatory Visit: Payer: Medicare PPO | Admitting: Surgical

## 2021-11-04 DIAGNOSIS — G8929 Other chronic pain: Secondary | ICD-10-CM

## 2021-11-04 DIAGNOSIS — M19012 Primary osteoarthritis, left shoulder: Secondary | ICD-10-CM | POA: Diagnosis not present

## 2021-11-04 DIAGNOSIS — M25512 Pain in left shoulder: Secondary | ICD-10-CM

## 2021-11-04 MED ORDER — METHYLPREDNISOLONE ACETATE 40 MG/ML IJ SUSP
40.0000 mg | INTRAMUSCULAR | Status: AC | PRN
  Administered 2021-11-04: 40 mg via INTRA_ARTICULAR

## 2021-11-04 MED ORDER — BUPIVACAINE HCL 0.5 % IJ SOLN
9.0000 mL | INTRAMUSCULAR | Status: AC | PRN
  Administered 2021-11-04: 9 mL via INTRA_ARTICULAR

## 2021-11-04 MED ORDER — LIDOCAINE HCL 1 % IJ SOLN
5.0000 mL | INTRAMUSCULAR | Status: AC | PRN
  Administered 2021-11-04: 5 mL

## 2021-11-04 NOTE — Progress Notes (Signed)
   Procedure Note  Patient: Lindsay Jimenez             Date of Birth: March 27, 1950           MRN: [id number]             Visit Date: 11/04/2021  Procedures: Visit Diagnoses:  1. Primary osteoarthritis of left shoulder   2. Chronic left shoulder pain     Large Joint Inj: L glenohumeral on 11/04/2021 9:21 AM Indications: diagnostic evaluation and pain Details: 22 G 1.5 in and 3.5 in needle, ultrasound-guided posterior approach  Arthrogram: No  Medications: 9 mL bupivacaine 0.5 %; 40 mg methylPREDNISolone acetate 40 MG/ML; 5 mL lidocaine 1 % Outcome: tolerated well, no immediate complications  Patient tolerated glenohumeral injection well.  Right glenohumeral injection from Dr. Ernestina Patches several days ago has done very well for her.  She will follow-up in 3 to 4 months for repeat injections.  She is going on a trip to [place]. Procedure, treatment alternatives, risks and benefits explained, specific risks discussed. Consent was given by the patient. Immediately prior to procedure a time out was called to verify the correct patient, procedure, equipment, support staff and site/side marked as required. Patient was prepped and draped in the usual sterile fashion.

## 2021-11-06 MED ORDER — TRIAMCINOLONE ACETONIDE 40 MG/ML IJ SUSP
40.0000 mg | INTRAMUSCULAR | Status: AC | PRN
  Administered 2021-10-30: 40 mg via INTRA_ARTICULAR

## 2021-11-06 MED ORDER — BUPIVACAINE HCL 0.25 % IJ SOLN
5.0000 mL | INTRAMUSCULAR | Status: AC | PRN
  Administered 2021-10-30: 5 mL via INTRA_ARTICULAR

## 2022-02-26 ENCOUNTER — Ambulatory Visit: Payer: Medicare PPO | Admitting: Orthopedic Surgery

## 2022-03-03 ENCOUNTER — Encounter: Payer: Self-pay | Admitting: Orthopedic Surgery

## 2022-03-03 ENCOUNTER — Ambulatory Visit: Payer: Medicare PPO | Admitting: Orthopedic Surgery

## 2022-03-03 ENCOUNTER — Ambulatory Visit: Payer: Self-pay

## 2022-03-03 DIAGNOSIS — M19012 Primary osteoarthritis, left shoulder: Secondary | ICD-10-CM

## 2022-03-03 DIAGNOSIS — G8929 Other chronic pain: Secondary | ICD-10-CM

## 2022-03-03 DIAGNOSIS — M19011 Primary osteoarthritis, right shoulder: Secondary | ICD-10-CM | POA: Diagnosis not present

## 2022-03-03 DIAGNOSIS — M25512 Pain in left shoulder: Secondary | ICD-10-CM

## 2022-03-03 MED ORDER — METHYLPREDNISOLONE ACETATE 40 MG/ML IJ SUSP
40.0000 mg | INTRAMUSCULAR | Status: AC | PRN
  Administered 2022-03-03: 40 mg via INTRA_ARTICULAR

## 2022-03-03 MED ORDER — BUPIVACAINE HCL 0.5 % IJ SOLN
9.0000 mL | INTRAMUSCULAR | Status: AC | PRN
  Administered 2022-03-03: 9 mL via INTRA_ARTICULAR

## 2022-03-03 MED ORDER — LIDOCAINE HCL 1 % IJ SOLN
5.0000 mL | INTRAMUSCULAR | Status: AC | PRN
  Administered 2022-03-03: 5 mL

## 2022-03-03 NOTE — Progress Notes (Signed)
Office Visit Note   Patient: Lindsay Jimenez           Date of Birth: [birth year]           MRN: [id number] Visit Date: 03/03/2022 Requested by: No referring provider defined for this encounter. PCP: Pcp, No  Subjective: Chief Complaint  Patient presents with   Other     Follow up bilateral shoulder pain    HPI: Lindsay Jimenez is a 72 y.o. female who presents to the office reporting bilateral shoulder pain right worse than left.  She had shoulder shoulders injected 11/04/2021.  Did get good relief for about 8 weeks.  She would like to have the right shoulder replaced.  She is right-hand dominant.  She is going to [place] for 10 days at the end of June.  She is also gone from February 7 until the 17th.  Pain from the right shoulder wakes her from sleep at night..                ROS: All systems reviewed are negative as they relate to the chief complaint within the history of present illness.  Patient denies fevers or chills.  Assessment & Plan: Visit Diagnoses:  1. Chronic left shoulder pain   2. Primary osteoarthritis of right shoulder     Plan: Impression is bilateral shoulder pain right worse than left.  Plan is right shoulder replacement.  Thin cut CT scan pending.  For patient specific instrumentation.  Left shoulder injection performed today.  The risk and benefits of surgery are discussed including not limited to infection or vessel damage instability dislocation incomplete pain relief as well as some functional limitation.  Patient understands risk and benefits and wishes to proceed.  Follow-Up Instructions: No follow-ups on file.   Orders:  Orders Placed This Encounter  Procedures   US Guided Needle Placement - No Linked Charges   CT SHOULDER RIGHT WO CONTRAST   No orders of the defined types were placed in this encounter.     Procedures: Large Joint Inj: L glenohumeral on 03/03/2022 7:43 PM Indications: diagnostic evaluation and pain Details: 18 G 1.5 in needle,  ultrasound-guided posterior approach  Arthrogram: No  Medications: 9 mL bupivacaine 0.5 %; 40 mg methylPREDNISolone acetate 40 MG/ML; 5 mL lidocaine 1 % Outcome: tolerated well, no immediate complications Procedure, treatment alternatives, risks and benefits explained, specific risks discussed. Consent was given by the patient. Immediately prior to procedure a time out was called to verify the correct patient, procedure, equipment, support staff and site/side marked as required. Patient was prepped and draped in the usual sterile fashion.      Clinical Data: No additional findings.  Objective: Vital Signs: There were no vitals taken for this visit.  Physical Exam:  Constitutional: Patient appears well-developed HEENT:  Head: Normocephalic Eyes:EOM are normal Neck: Normal range of motion Cardiovascular: Normal rate Pulmonary/chest: Effort normal Neurologic: Patient is alert Skin: Skin is warm Psychiatric: Patient has normal mood and affect  Ortho Exam: Ortho exam demonstrates good deltoid function bilaterally.  On the right her range of motion is 20/80/110.  Range of motion on the left is 30/95/130.  She has good subscap strength bilaterally.  A lot of coarseness and grinding on the right with passive range of motion.  Radial pulse intact bilaterally.  Specialty Comments:  No specialty comments available.  Imaging: No results found.   PMFS History: Patient Active Problem List   Diagnosis Date Noted   Status post  total left knee replacement 11/19/2018   Unilateral primary osteoarthritis, left knee 08/21/2016   Chronic pain of left knee 08/21/2016   Osteoarthritis of right knee 03/02/2015   Status post total right knee replacement 03/02/2015   Osteoarthritis of right hip 10/20/2014   Status post total replacement of right hip 10/20/2014   Past Medical History:  Diagnosis Date   Arthritis    oa   Breast CA ([place]) 2002   bilateral   Family history of adverse reaction  to anesthesia    aunt had problem , not sure what problem  was   GERD (gastroesophageal reflux disease)    occasional   PONV (postoperative nausea and vomiting)     No family history on file.  Past Surgical History:  Procedure Laterality Date   ablation to both knees  april and may 2016   to deaden nerves   KNEE SURGERY Right 1970   cartlidge removed   MASTECTOMY Bilateral 2002   OOPHORECTOMY Bilateral 2004   TOTAL HIP ARTHROPLASTY Right 10/20/2014   Procedure: RIGHT TOTAL HIP ARTHROPLASTY ANTERIOR APPROACH;  Surgeon: Mcarthur Rossetti, MD;  Location: [facility];  Service: Orthopedics;  Laterality: Right;   TOTAL KNEE ARTHROPLASTY Right 03/02/2015   Procedure: RIGHT TOTAL KNEE ARTHROPLASTY;  Surgeon: Mcarthur Rossetti, MD;  Location: [facility];  Service: Orthopedics;  Laterality: Right;   TOTAL KNEE ARTHROPLASTY Left 11/19/2018   Procedure: LEFT TOTAL KNEE ARTHROPLASTY;  Surgeon: Mcarthur Rossetti, MD;  Location: [facility];  Service: Orthopedics;  Laterality: Left;   Social History   Occupational History   Not on file  Tobacco Use   Smoking status: Never   Smokeless tobacco: Never  Vaping Use   Vaping Use: Never used  Substance and Sexual Activity   Alcohol use: No   Drug use: No   Sexual activity: Not on file

## 2022-03-10 ENCOUNTER — Ambulatory Visit
Admission: RE | Admit: 2022-03-10 | Discharge: 2022-03-10 | Disposition: A | Discharge status: Home / Self Care | Payer: Medicare PPO | Source: Ambulatory Visit | Attending: Orthopedic Surgery | Admitting: Orthopedic Surgery

## 2022-03-10 DIAGNOSIS — M19011 Primary osteoarthritis, right shoulder: Secondary | ICD-10-CM

## 2022-03-12 ENCOUNTER — Other Ambulatory Visit: Payer: Self-pay

## 2022-03-31 ENCOUNTER — Other Ambulatory Visit: Payer: Medicare PPO

## 2022-04-07 NOTE — Pre-Procedure Instructions (Signed)
Surgical Instructions    Your procedure is scheduled on April 10, 2022.  Report to [facility] Main Entrance "A" at 5:30 A.M., then check in with the Admitting office.  Call this number if you have problems the morning of surgery:  [phone]  If you have any questions prior to your surgery date call [phone]: Open Monday-Friday 8am-4pm If you experience any cold or flu symptoms such as cough, fever, chills, shortness of breath, etc. between now and your scheduled surgery, please notify us at the above number.     Remember:  Do not eat after midnight the night before your surgery  You may drink clear liquids until 4:30 AM the morning of your surgery.   Clear liquids allowed are: Water, Non-Citrus Juices (without pulp), Carbonated Beverages, Clear Tea, Black Coffee Only (NO MILK, CREAM OR POWDERED CREAMER of any kind), and Gatorade.  Patient Instructions  The night before surgery:  No food after midnight. ONLY clear liquids after midnight  The day of surgery (if you do NOT have diabetes):  Drink ONE (1) Pre-Surgery Clear Ensure by 4:30 AM the morning of surgery. Drink in one sitting. Do not sip.  This drink was given to you during your hospital  pre-op appointment visit.  Nothing else to drink after completing the  Pre-Surgery Clear Ensure.         If you have questions, please contact your surgeon's office.      Take these medicines the morning of surgery with A SIP OF WATER:  acetaminophen (TYLENOL)     May take these medicines IF NEEDED:  cetirizine (ZYRTEC)   diphenhydrAMINE (BENADRYL)   famotidine (PEPCID)   fluticasone (FLONASE) nasal spray   methocarbamol (ROBAXIN)   Polyethyl Glycol-Propyl Glycol (SYSTANE OP)      As of today, STOP taking any Aspirin (unless otherwise instructed by your surgeon) Aleve, Naproxen, Ibuprofen, Motrin, Advil, Goody's, BC's, all herbal medications, fish oil, and all vitamins.                     Do NOT Smoke  (Tobacco/Vaping) for 24 hours prior to your procedure.  If you use a CPAP at night, you may bring your mask/headgear for your overnight stay.   Contacts, glasses, piercing's, hearing aid's, dentures or partials may not be worn into surgery, please bring cases for these belongings.    For patients admitted to the hospital, discharge time will be determined by your treatment team.   Patients discharged the day of surgery will not be allowed to drive home, and someone needs to stay with them for 24 hours.  SURGICAL WAITING ROOM VISITATION Patients having surgery or a procedure may have no more than 2 support people in the waiting area - these visitors may rotate.   Children under the age of 4 must have an adult with them who is not the patient. If the patient needs to stay at the hospital during part of their recovery, the visitor guidelines for inpatient rooms apply. Pre-op nurse will coordinate an appropriate time for 1 support person to accompany patient in pre-op.  This support person may not rotate.   Please refer to the [facility] website for the visitor guidelines for Inpatients (after your surgery is over and you are in a regular room).   Oral Hygiene is also important to reduce your risk of infection.  Remember - BRUSH YOUR TEETH THE MORNING OF SURGERY WITH YOUR REGULAR TOOTHPASTE  [place]- Preparing for Total Shoulder Arthroplasty  Before surgery, you can play an important role. Because skin is not sterile, your skin needs to be as free of germs as possible. You can reduce the number of germs on your skin by using the following products.   Benzoyl Peroxide Gel  o Reduces the number of germs present on the skin  o Applied twice a day to shoulder area starting two days before surgery   Chlorhexidine Gluconate (CHG) Soap (instructions listed above on how to wash with CHG Soap)  o An antiseptic cleaner that kills germs and bonds with the skin to continue killing germs even  after washing  o Used for showering the night before surgery and morning of surgery   ==================================================================  Please follow these instructions carefully:  BENZOYL PEROXIDE 5% GEL  Please do not use if you have an allergy to benzoyl peroxide. If your skin becomes reddened/irritated stop using the benzoyl peroxide.  Starting two days before surgery, apply as follows:  1. Apply benzoyl peroxide in the morning and at night. Apply after taking a shower. If you are not taking a shower clean entire shoulder front, back, and side along with the armpit with a clean wet washcloth.  2. Place a quarter-sized dollop on your SHOULDER and rub in thoroughly, making sure to cover the front, back, and side of your shoulder, along with the armpit.   2 Days prior to Surgery First Dose on Feb. 27th Morning Second Dose on Feb. 27th Night  Day Before Surgery First Dose on Feb. 28th Morning Night before surgery wash (entire body except face and private areas) with CHG Soap THEN Second Dose on Feb. 28th Night   Morning of Surgery  wash BODY AGAIN with CHG Soap   4. Do NOT apply benzoyl peroxide gel on the day of surgery   [place]- Preparing For Surgery  Before surgery, you can play an important role. Because skin is not sterile, your skin needs to be as free of germs as possible. You can reduce the number of germs on your skin by washing with CHG (chlorahexidine gluconate) Soap before surgery.  CHG is an antiseptic cleaner which kills germs and bonds with the skin to continue killing germs even after washing.     Please do not use if you have an allergy to CHG or antibacterial soaps. If your skin becomes reddened/irritated stop using the CHG.  Do not shave (including legs and underarms) for at least 48 hours prior to first CHG shower. It is OK to shave your face.  Please follow these instructions carefully.     Shower the NIGHT BEFORE SURGERY and  the MORNING OF SURGERY with CHG Soap.   If you chose to wash your hair, wash your hair first as usual with your normal shampoo. After you shampoo, rinse your hair and body thoroughly to remove the shampoo.  Then [employer] and genitals (private parts) with your normal soap and rinse thoroughly to remove soap.  After that Use CHG Soap as you would any other liquid soap. You can apply CHG directly to the skin and wash gently with a scrungie or a clean washcloth.   Apply the CHG Soap to your body ONLY FROM THE NECK DOWN.  Do not use on open wounds or open sores. Avoid contact with your eyes, ears, mouth and genitals (private parts). Wash Face and genitals (private parts)  with your normal soap.   Wash thoroughly, paying special attention to the area where your surgery will be performed.  Thoroughly rinse your body with warm water from the neck down.  DO NOT shower/wash with your normal soap after using and rinsing off the CHG Soap.  Pat yourself dry with a CLEAN TOWEL.  8. Apply the Benzoyl Peroxide only the night before surgery.  Do Not use it the morning of surgery.  Wear CLEAN PAJAMAS to bed the night before surgery  Place CLEAN SHEETS on your bed the night before your surgery  DO NOT SLEEP WITH PETS.   Day of Surgery: Take a shower with CHG soap. Do not wear jewelry or makeup Do not wear lotions, powders, perfumes/colognes, or deodorant. Do not shave 48 hours prior to surgery.  Men may shave face and neck. Do not bring valuables to the hospital.  [facility] is not responsible for any belongings or valuables. Do not wear nail polish, gel polish, artificial nails, or any other type of covering on natural nails (fingers and toes) If you have artificial nails or gel coating that need to be removed by a nail salon, please have this removed prior to surgery. Artificial nails or gel coating may interfere with anesthesia's ability to adequately monitor your vital signs.  Wear  Clean/Comfortable clothing the morning of surgery Remember to brush your teeth WITH YOUR REGULAR TOOTHPASTE.   Please read over the following fact sheets that you were given.    If you received a COVID test during your pre-op visit  it is requested that you wear a mask when out in public, stay away from anyone that may not be feeling well and notify your surgeon if you develop symptoms. If you have been in contact with anyone that has tested positive in the last 10 days please notify you surgeon.

## 2022-04-08 ENCOUNTER — Encounter (HOSPITAL_COMMUNITY): Payer: Self-pay

## 2022-04-08 ENCOUNTER — Other Ambulatory Visit: Payer: Self-pay

## 2022-04-08 ENCOUNTER — Encounter (HOSPITAL_COMMUNITY)
Admission: RE | Admit: 2022-04-08 | Discharge: 2022-04-08 | Disposition: A | Discharge status: Home / Self Care | Payer: Medicare PPO | Source: Ambulatory Visit | Attending: Orthopedic Surgery | Admitting: Orthopedic Surgery

## 2022-04-08 VITALS — BP 148/84 | HR 87 | Temp 98.1°F | Resp 17 | Ht 68.0 in | Wt 204.0 lb

## 2022-04-08 DIAGNOSIS — Z01818 Encounter for other preprocedural examination: Secondary | ICD-10-CM | POA: Diagnosis present

## 2022-04-08 DIAGNOSIS — Z9013 Acquired absence of bilateral breasts and nipples: Secondary | ICD-10-CM | POA: Diagnosis not present

## 2022-04-08 DIAGNOSIS — Z853 Personal history of malignant neoplasm of breast: Secondary | ICD-10-CM | POA: Insufficient documentation

## 2022-04-08 DIAGNOSIS — K219 Gastro-esophageal reflux disease without esophagitis: Secondary | ICD-10-CM | POA: Insufficient documentation

## 2022-04-08 DIAGNOSIS — Z01812 Encounter for preprocedural laboratory examination: Secondary | ICD-10-CM | POA: Diagnosis not present

## 2022-04-08 LAB — URINALYSIS, ROUTINE W REFLEX MICROSCOPIC
Bilirubin Urine: NEGATIVE
Glucose, UA: NEGATIVE mg/dL
Hgb urine dipstick: NEGATIVE
Ketones, ur: NEGATIVE mg/dL
Leukocytes,Ua: NEGATIVE
Nitrite: NEGATIVE
Protein, ur: NEGATIVE mg/dL
Specific Gravity, Urine: 1.01 (ref 1.005–1.030)
pH: 7 (ref 5.0–8.0)

## 2022-04-08 LAB — SURGICAL PCR SCREEN
MRSA, PCR: NEGATIVE
Staphylococcus aureus: POSITIVE — AB

## 2022-04-08 LAB — BASIC METABOLIC PANEL
Anion gap: 10 (ref 5–15)
BUN: 14 mg/dL (ref 8–23)
CO2: 27 mmol/L (ref 22–32)
Calcium: 9.9 mg/dL (ref 8.9–10.3)
Chloride: 102 mmol/L (ref 98–111)
Creatinine, Ser: 0.76 mg/dL (ref 0.44–1.00)
GFR, Estimated: >60 mL/min (ref >60)
Glucose, Bld: 91 mg/dL (ref 70–99)
Potassium: 3.9 mmol/L (ref 3.5–5.1)
Sodium: 139 mmol/L (ref 135–145)

## 2022-04-08 LAB — CBC
HCT: 42.5 % (ref 36.0–46.0)
Hemoglobin: 14.5 g/dL (ref 12.0–15.0)
MCH: 30.8 pg (ref 26.0–34.0)
MCHC: 34.1 g/dL (ref 30.0–36.0)
MCV: 90.2 fL (ref 80.0–100.0)
Platelets: 256 10*3/uL (ref 150–400)
RBC: 4.71 MIL/uL (ref 3.87–5.11)
RDW: 12.5 % (ref 11.5–15.5)
WBC: 7.4 10*3/uL (ref 4.0–10.5)
nRBC: 0 % (ref 0.0–0.2)

## 2022-04-08 NOTE — Progress Notes (Signed)
PCP - Dr. Merian Capron ([place]. Rondall Allegra [phone]) Cardiologist - Denies  PPM/ICD - Denies Device Orders - n/a Rep Notified - n/a  Chest x-ray - n/a EKG - Denies Stress Test - Denies ECHO - Denies Cardiac Cath - Denies  Sleep Study - Denies CPAP - n/a  No DM  Last dose of GLP1 agonist- n/a GLP1 instructions: n/a  Blood Thinner Instructions: n/a Aspirin Instructions: n/a  ERAS Protcol - Clear liquids until 0430 morning of surgery PRE-SURGERY Ensure or G2- Ensure given to pt with instructions  COVID TEST- n/a   Anesthesia review: Yes. Seen by Karoline Caldwell, PA-C. Pt endorses a few day hx of stuffy nose. Only has a cough in the morning after waking up. Drainage is clear and sometimes yellow. She has taken Flonase and Zyrtec to help with symptoms.    Patient denies shortness of breath, fever, cough and chest pain at PAT appointment   All instructions explained to the patient, with a verbal understanding of the material. Patient agrees to go over the instructions while at home for a better understanding. Patient also instructed to self quarantine after being tested for COVID-19. The opportunity to ask questions was provided.

## 2022-04-09 LAB — URINE CULTURE: Culture: NO GROWTH

## 2022-04-09 NOTE — Anesthesia Preprocedure Evaluation (Addendum)
Anesthesia Evaluation  Patient identified by MRN, date of birth, ID band Patient awake    Reviewed: Allergy & Precautions, NPO status , Patient's Chart, lab work & pertinent test results  History of Anesthesia Complications (+) PONV, Family history of anesthesia reaction and history of anesthetic complications  Airway Mallampati: II  TM Distance: >3 FB Neck ROM: Full    Dental  (+) Teeth Intact, Dental Advisory Given   Pulmonary    breath sounds clear to auscultation       Cardiovascular negative cardio ROS  Rhythm:Regular Rate:Normal     Neuro/Psych negative neurological ROS  negative psych ROS   GI/Hepatic Neg liver ROS,GERD  Medicated,,  Endo/Other  negative endocrine ROS    Renal/GU negative Renal ROS     Musculoskeletal  (+) Arthritis ,    Abdominal Normal abdominal exam  (+)   Peds  Hematology negative hematology ROS (+)   Anesthesia Other Findings   Reproductive/Obstetrics negative OB ROS                             Anesthesia Physical Anesthesia Plan  ASA: 2  Anesthesia Plan: Regional and General   Post-op Pain Management:  Regional for Post-op pain and Regional block*, Minimal or no pain anticipated, Tylenol PO (pre-op)* and Celebrex PO (pre-op)*   Induction: Intravenous  PONV Risk Score and Plan: 4 or greater and Ondansetron, Midazolam, Dexamethasone and Propofol infusion  Airway Management Planned: Oral ETT and LMA  Additional Equipment: None  Intra-op Plan:   Post-operative Plan: Extubation in OR  Informed Consent: I have reviewed the patients History and Physical, chart, labs and discussed the procedure including the risks, benefits and alternatives for the proposed anesthesia with the patient or authorized representative who has indicated his/her understanding and acceptance.     Dental advisory given  Plan Discussed with: CRNA and  Anesthesiologist  Anesthesia Plan Comments: (PAT note by Karoline Caldwell, PA-C: 72 year old female with pertinent history including GERD, PONV, breast cancer s/p bilateral mastectomy 2002.  I spoke to patient and her PAT appointment due to report of sinus congestion.  She reports a long history of waxing waning sinus congestion, generally related to changes in the weather.  She denies any purulent drainage, denies any cough or other respiratory symptoms.  She states she just feels like she has "stuffiness" in her nose and cannot breathe through her nose.  Overall she feels well and she states that this congestion has not unusual for her.  She says she has been using Flonase and Zyrtec without much benefit.  I called the patient on 04/09/2022 to follow-up on her sinus congestion.  She reported that she actually feels this is improved today, continues to deny any symptoms of URI, no cough, shortness of breath, fever, or other signs of systemic illness.  Anticipate she can proceed with surgery as planned barring acute status change.  Labs reviewed, WNL.  )        Anesthesia Quick Evaluation

## 2022-04-09 NOTE — Progress Notes (Signed)
Anesthesia Chart Review:  72 year old female with pertinent history including GERD, PONV, breast cancer s/p bilateral mastectomy 2002.  I spoke to patient and her PAT appointment due to report of sinus congestion.  She reports a long history of waxing waning sinus congestion, generally related to changes in the weather.  She denies any purulent drainage, denies any cough or other respiratory symptoms.  She states she just feels like she has "stuffiness" in her nose and cannot breathe through her nose.  Overall she feels well and she states that this congestion has not unusual for her.  She says she has been using Flonase and Zyrtec without much benefit.  I called the patient on 04/09/2022 to follow-up on her sinus congestion.  She reported that she actually feels this is improved today, continues to deny any symptoms of URI, no cough, shortness of breath, fever, or other signs of systemic illness.  Anticipate she can proceed with surgery as planned barring acute status change.  Labs reviewed, WNL.    Wynonia Musty [facility] Short Stay Center/Anesthesiology Phone [phone] 04/09/2022 3:33 PM

## 2022-04-10 ENCOUNTER — Encounter (HOSPITAL_COMMUNITY)
Admission: RE | Disposition: A | Discharge status: Home / Self Care | Payer: Self-pay | Source: Home / Self Care | Attending: Orthopedic Surgery

## 2022-04-10 ENCOUNTER — Other Ambulatory Visit: Payer: Self-pay

## 2022-04-10 ENCOUNTER — Observation Stay (HOSPITAL_COMMUNITY): Payer: Medicare PPO

## 2022-04-10 ENCOUNTER — Ambulatory Visit (HOSPITAL_BASED_OUTPATIENT_CLINIC_OR_DEPARTMENT_OTHER): Payer: Medicare PPO | Admitting: Anesthesiology

## 2022-04-10 ENCOUNTER — Observation Stay (HOSPITAL_COMMUNITY)
Admission: RE | Admit: 2022-04-10 | Discharge: 2022-04-11 | Disposition: A | Discharge status: Home / Self Care | Payer: Medicare PPO | Attending: Orthopedic Surgery | Admitting: Orthopedic Surgery

## 2022-04-10 ENCOUNTER — Encounter (HOSPITAL_COMMUNITY): Payer: Self-pay | Admitting: Orthopedic Surgery

## 2022-04-10 ENCOUNTER — Ambulatory Visit (HOSPITAL_COMMUNITY): Payer: Medicare PPO | Admitting: Physician Assistant

## 2022-04-10 DIAGNOSIS — M7521 Bicipital tendinitis, right shoulder: Secondary | ICD-10-CM

## 2022-04-10 DIAGNOSIS — M19011 Primary osteoarthritis, right shoulder: Secondary | ICD-10-CM | POA: Diagnosis not present

## 2022-04-10 DIAGNOSIS — Z79899 Other long term (current) drug therapy: Secondary | ICD-10-CM | POA: Insufficient documentation

## 2022-04-10 DIAGNOSIS — Z96611 Presence of right artificial shoulder joint: Secondary | ICD-10-CM

## 2022-04-10 DIAGNOSIS — Z96653 Presence of artificial knee joint, bilateral: Secondary | ICD-10-CM | POA: Diagnosis not present

## 2022-04-10 DIAGNOSIS — Z01818 Encounter for other preprocedural examination: Secondary | ICD-10-CM

## 2022-04-10 DIAGNOSIS — Z853 Personal history of malignant neoplasm of breast: Secondary | ICD-10-CM | POA: Diagnosis not present

## 2022-04-10 DIAGNOSIS — Z96641 Presence of right artificial hip joint: Secondary | ICD-10-CM | POA: Diagnosis not present

## 2022-04-10 DIAGNOSIS — Z91040 Latex allergy status: Secondary | ICD-10-CM | POA: Insufficient documentation

## 2022-04-10 HISTORY — PX: TOTAL SHOULDER ARTHROPLASTY: SHX126

## 2022-04-10 SURGERY — ARTHROPLASTY, SHOULDER, TOTAL
Anesthesia: Regional | Site: Shoulder | Laterality: Right

## 2022-04-10 MED ORDER — STERILE WATER FOR IRRIGATION IR SOLN
Status: DC | PRN
  Administered 2022-04-10: 1000 mL

## 2022-04-10 MED ORDER — OXYCODONE HCL 5 MG PO TABS
ORAL_TABLET | ORAL | Status: AC
  Filled 2022-04-10: qty 1

## 2022-04-10 MED ORDER — TRANEXAMIC ACID-NACL 1000-0.7 MG/100ML-% IV SOLN
1000.0000 mg | INTRAVENOUS | Status: AC
  Administered 2022-04-10: 1000 mg via INTRAVENOUS
  Filled 2022-04-10: qty 100

## 2022-04-10 MED ORDER — ACETAMINOPHEN 500 MG PO TABS
1000.0000 mg | ORAL_TABLET | Freq: Four times a day (QID) | ORAL | Status: DC
  Administered 2022-04-10 – 2022-04-11 (×2): 1000 mg via ORAL
  Filled 2022-04-10 (×2): qty 2

## 2022-04-10 MED ORDER — ROCURONIUM BROMIDE 10 MG/ML (PF) SYRINGE
PREFILLED_SYRINGE | INTRAVENOUS | Status: DC | PRN
  Administered 2022-04-10: 40 mg via INTRAVENOUS

## 2022-04-10 MED ORDER — ONDANSETRON HCL 4 MG/2ML IJ SOLN
INTRAMUSCULAR | Status: AC
  Filled 2022-04-10: qty 2

## 2022-04-10 MED ORDER — ONDANSETRON HCL 4 MG PO TABS: 4.0000 mg | ORAL_TABLET | Freq: Four times a day (QID) | ORAL | Status: DC | PRN

## 2022-04-10 MED ORDER — HYDROMORPHONE HCL 1 MG/ML IJ SOLN: 0.5000 mg | INTRAMUSCULAR | Status: DC | PRN

## 2022-04-10 MED ORDER — EPINEPHRINE PF 1 MG/ML IJ SOLN
INTRAMUSCULAR | Status: AC
  Filled 2022-04-10: qty 1

## 2022-04-10 MED ORDER — ACETAMINOPHEN 325 MG PO TABS: 325.0000 mg | ORAL_TABLET | ORAL | Status: DC | PRN

## 2022-04-10 MED ORDER — SODIUM CHLORIDE 0.9 % IV SOLN: INTRAVENOUS | Status: DC

## 2022-04-10 MED ORDER — ORAL CARE MOUTH RINSE: 15.0000 mL | Freq: Once | OROMUCOSAL | Status: AC

## 2022-04-10 MED ORDER — PHENOL 1.4 % MT LIQD: 1.0000 | OROMUCOSAL | Status: DC | PRN

## 2022-04-10 MED ORDER — BUPIVACAINE HCL (PF) 0.5 % IJ SOLN
INTRAMUSCULAR | Status: DC | PRN
  Administered 2022-04-10: 15 mL via PERINEURAL

## 2022-04-10 MED ORDER — FENTANYL CITRATE (PF) 100 MCG/2ML IJ SOLN
25.0000 ug | INTRAMUSCULAR | Status: DC | PRN
  Administered 2022-04-10 (×2): 50 ug via INTRAVENOUS

## 2022-04-10 MED ORDER — LIDOCAINE 2% (20 MG/ML) 5 ML SYRINGE
INTRAMUSCULAR | Status: DC | PRN
  Administered 2022-04-10: 100 mg via INTRAVENOUS

## 2022-04-10 MED ORDER — OXYCODONE HCL 5 MG PO TABS
5.0000 mg | ORAL_TABLET | ORAL | Status: DC | PRN
  Administered 2022-04-11: 10 mg via ORAL
  Filled 2022-04-10: qty 2

## 2022-04-10 MED ORDER — CHLORHEXIDINE GLUCONATE 0.12 % MT SOLN
OROMUCOSAL | Status: AC
  Filled 2022-04-10: qty 15

## 2022-04-10 MED ORDER — ONDANSETRON HCL 4 MG/2ML IJ SOLN
INTRAMUSCULAR | Status: DC | PRN
  Administered 2022-04-10: 4 mg via INTRAVENOUS

## 2022-04-10 MED ORDER — ALIGN 4 MG PO CAPS: 4.0000 mg | ORAL_CAPSULE | Freq: Every day | ORAL | Status: DC

## 2022-04-10 MED ORDER — LIDOCAINE 2% (20 MG/ML) 5 ML SYRINGE
INTRAMUSCULAR | Status: AC
  Filled 2022-04-10: qty 5

## 2022-04-10 MED ORDER — NAPROXEN 250 MG PO TABS
250.0000 mg | ORAL_TABLET | Freq: Two times a day (BID) | ORAL | Status: DC
  Filled 2022-04-10 (×3): qty 1

## 2022-04-10 MED ORDER — PROPOFOL 10 MG/ML IV BOLUS
INTRAVENOUS | Status: DC | PRN
  Administered 2022-04-10: 150 mg via INTRAVENOUS

## 2022-04-10 MED ORDER — LACTATED RINGERS IV SOLN: INTRAVENOUS | Status: DC

## 2022-04-10 MED ORDER — BUPIVACAINE LIPOSOME 1.3 % IJ SUSP
INTRAMUSCULAR | Status: DC | PRN
  Administered 2022-04-10: 10 mL

## 2022-04-10 MED ORDER — VANCOMYCIN HCL 1000 MG IV SOLR
INTRAVENOUS | Status: AC
  Filled 2022-04-10: qty 20

## 2022-04-10 MED ORDER — CEFAZOLIN SODIUM-DEXTROSE 2-4 GM/100ML-% IV SOLN
INTRAVENOUS | Status: AC
  Filled 2022-04-10: qty 100

## 2022-04-10 MED ORDER — EPHEDRINE 5 MG/ML INJ
INTRAVENOUS | Status: AC
  Filled 2022-04-10: qty 5

## 2022-04-10 MED ORDER — ASPIRIN 81 MG PO TBEC
81.0000 mg | DELAYED_RELEASE_TABLET | Freq: Every day | ORAL | Status: DC
  Administered 2022-04-11: 81 mg via ORAL
  Filled 2022-04-10: qty 1

## 2022-04-10 MED ORDER — ROCURONIUM BROMIDE 10 MG/ML (PF) SYRINGE
PREFILLED_SYRINGE | INTRAVENOUS | Status: AC
  Filled 2022-04-10: qty 10

## 2022-04-10 MED ORDER — MEPERIDINE HCL 25 MG/ML IJ SOLN: 6.2500 mg | INTRAMUSCULAR | Status: DC | PRN

## 2022-04-10 MED ORDER — ACETAMINOPHEN 500 MG PO TABS
ORAL_TABLET | ORAL | Status: AC
  Administered 2022-04-11: 500 mg
  Filled 2022-04-10: qty 2

## 2022-04-10 MED ORDER — BUPIVACAINE HCL (PF) 0.5 % IJ SOLN
INTRAMUSCULAR | Status: AC
  Filled 2022-04-10: qty 30

## 2022-04-10 MED ORDER — DEXAMETHASONE SODIUM PHOSPHATE 10 MG/ML IJ SOLN
INTRAMUSCULAR | Status: AC
  Filled 2022-04-10: qty 1

## 2022-04-10 MED ORDER — MIDAZOLAM HCL 2 MG/2ML IJ SOLN
INTRAMUSCULAR | Status: DC | PRN
  Administered 2022-04-10: 2 mg via INTRAVENOUS

## 2022-04-10 MED ORDER — FENTANYL CITRATE (PF) 250 MCG/5ML IJ SOLN
INTRAMUSCULAR | Status: AC
  Filled 2022-04-10: qty 5

## 2022-04-10 MED ORDER — METHOCARBAMOL 1000 MG/10ML IJ SOLN: 500.0000 mg | Freq: Four times a day (QID) | INTRAVENOUS | Status: DC | PRN

## 2022-04-10 MED ORDER — CLONIDINE HCL (ANALGESIA) 100 MCG/ML EP SOLN
EPIDURAL | Status: AC
  Filled 2022-04-10: qty 10

## 2022-04-10 MED ORDER — EPHEDRINE SULFATE-NACL 50-0.9 MG/10ML-% IV SOSY
PREFILLED_SYRINGE | INTRAVENOUS | Status: DC | PRN
  Administered 2022-04-10 (×3): 5 mg via INTRAVENOUS

## 2022-04-10 MED ORDER — ONDANSETRON HCL 4 MG/2ML IJ SOLN: 4.0000 mg | Freq: Once | INTRAMUSCULAR | Status: DC | PRN

## 2022-04-10 MED ORDER — SUGAMMADEX SODIUM 200 MG/2ML IV SOLN
INTRAVENOUS | Status: DC | PRN
  Administered 2022-04-10: 100 mg via INTRAVENOUS

## 2022-04-10 MED ORDER — PHENYLEPHRINE HCL-NACL 20-0.9 MG/250ML-% IV SOLN
INTRAVENOUS | Status: DC | PRN
  Administered 2022-04-10: 25 ug/min via INTRAVENOUS

## 2022-04-10 MED ORDER — OXYCODONE HCL 5 MG/5ML PO SOLN: 5.0000 mg | Freq: Once | ORAL | Status: DC | PRN

## 2022-04-10 MED ORDER — FENTANYL CITRATE (PF) 250 MCG/5ML IJ SOLN
INTRAMUSCULAR | Status: DC | PRN
  Administered 2022-04-10: 50 ug via INTRAVENOUS

## 2022-04-10 MED ORDER — MORPHINE SULFATE (PF) 4 MG/ML IV SOLN
INTRAVENOUS | Status: AC
  Filled 2022-04-10: qty 2

## 2022-04-10 MED ORDER — FAMOTIDINE 10 MG PO TABS: 10.0000 mg | ORAL_TABLET | Freq: Every day | ORAL | Status: DC | PRN

## 2022-04-10 MED ORDER — 0.9 % SODIUM CHLORIDE (POUR BTL) OPTIME
TOPICAL | Status: DC | PRN
  Administered 2022-04-10 (×4): 1000 mL

## 2022-04-10 MED ORDER — ONDANSETRON HCL 4 MG/2ML IJ SOLN: 4.0000 mg | Freq: Four times a day (QID) | INTRAMUSCULAR | Status: DC | PRN

## 2022-04-10 MED ORDER — IRRISEPT - 450ML BOTTLE WITH 0.05% CHG IN STERILE WATER, USP 99.95% OPTIME
TOPICAL | Status: DC | PRN
  Administered 2022-04-10: 450 mL

## 2022-04-10 MED ORDER — METOCLOPRAMIDE HCL 5 MG/ML IJ SOLN: 5.0000 mg | Freq: Three times a day (TID) | INTRAMUSCULAR | Status: DC | PRN

## 2022-04-10 MED ORDER — OXYCODONE HCL 5 MG PO TABS
5.0000 mg | ORAL_TABLET | Freq: Once | ORAL | Status: AC | PRN
  Administered 2022-04-10: 5 mg via ORAL

## 2022-04-10 MED ORDER — DEXAMETHASONE SODIUM PHOSPHATE 10 MG/ML IJ SOLN
INTRAMUSCULAR | Status: DC | PRN
  Administered 2022-04-10: 10 mg via INTRAVENOUS

## 2022-04-10 MED ORDER — METOCLOPRAMIDE HCL 5 MG PO TABS: 5.0000 mg | ORAL_TABLET | Freq: Three times a day (TID) | ORAL | Status: DC | PRN

## 2022-04-10 MED ORDER — ACETAMINOPHEN 325 MG PO TABS: 325.0000 mg | ORAL_TABLET | Freq: Four times a day (QID) | ORAL | Status: DC | PRN

## 2022-04-10 MED ORDER — DOCUSATE SODIUM 100 MG PO CAPS
100.0000 mg | ORAL_CAPSULE | Freq: Two times a day (BID) | ORAL | Status: DC
  Administered 2022-04-10: 100 mg via ORAL
  Filled 2022-04-10 (×2): qty 1

## 2022-04-10 MED ORDER — MIDAZOLAM HCL 2 MG/2ML IJ SOLN
INTRAMUSCULAR | Status: AC
  Filled 2022-04-10: qty 2

## 2022-04-10 MED ORDER — CEFAZOLIN SODIUM-DEXTROSE 2-4 GM/100ML-% IV SOLN
2.0000 g | Freq: Three times a day (TID) | INTRAVENOUS | Status: AC
  Administered 2022-04-10 – 2022-04-11 (×3): 2 g via INTRAVENOUS
  Filled 2022-04-10 (×3): qty 100

## 2022-04-10 MED ORDER — POVIDONE-IODINE 10 % EX SWAB
2.0000 | Freq: Once | CUTANEOUS | Status: AC
  Administered 2022-04-10: 2 via TOPICAL

## 2022-04-10 MED ORDER — POVIDONE-IODINE 7.5 % EX SOLN
Freq: Once | CUTANEOUS | Status: AC
  Filled 2022-04-10: qty 118

## 2022-04-10 MED ORDER — ALBUMIN HUMAN 5 % IV SOLN: INTRAVENOUS | Status: DC | PRN

## 2022-04-10 MED ORDER — LORATADINE 10 MG PO TABS
10.0000 mg | ORAL_TABLET | Freq: Every day | ORAL | Status: DC
  Filled 2022-04-10: qty 1

## 2022-04-10 MED ORDER — FENTANYL CITRATE (PF) 100 MCG/2ML IJ SOLN
INTRAMUSCULAR | Status: AC
  Filled 2022-04-10: qty 2

## 2022-04-10 MED ORDER — METHOCARBAMOL 500 MG PO TABS
500.0000 mg | ORAL_TABLET | Freq: Four times a day (QID) | ORAL | Status: DC | PRN
  Administered 2022-04-10: 500 mg via ORAL
  Filled 2022-04-10: qty 1

## 2022-04-10 MED ORDER — CHLORHEXIDINE GLUCONATE 0.12 % MT SOLN
15.0000 mL | Freq: Once | OROMUCOSAL | Status: AC
  Administered 2022-04-10: 15 mL via OROMUCOSAL

## 2022-04-10 MED ORDER — VANCOMYCIN HCL 1000 MG IV SOLR
INTRAVENOUS | Status: DC | PRN
  Administered 2022-04-10: 1000 mg via TOPICAL

## 2022-04-10 MED ORDER — PROPOFOL 10 MG/ML IV BOLUS
INTRAVENOUS | Status: AC
  Filled 2022-04-10: qty 20

## 2022-04-10 MED ORDER — MENTHOL 3 MG MT LOZG: 1.0000 | LOZENGE | OROMUCOSAL | Status: DC | PRN

## 2022-04-10 MED ORDER — CEFAZOLIN SODIUM-DEXTROSE 2-4 GM/100ML-% IV SOLN
2.0000 g | INTRAVENOUS | Status: AC
  Administered 2022-04-10: 2 g via INTRAVENOUS
  Filled 2022-04-10: qty 100

## 2022-04-10 MED ORDER — ACETAMINOPHEN 160 MG/5ML PO SOLN: 325.0000 mg | ORAL | Status: DC | PRN

## 2022-04-10 SURGICAL SUPPLY — 91 items
AID PSTN UNV HD RSTRNT DISP (MISCELLANEOUS) ×1
ALCOHOL 70% 16 OZ (MISCELLANEOUS) ×1 IMPLANT
ANCH SUT SWLK 19.1X4.75 VT (Anchor) ×1 IMPLANT
ANCHOR PEEK 4.75X19.1 SWLK C (Anchor) IMPLANT
APL PRP STRL LF DISP 70% ISPRP (MISCELLANEOUS) ×1
BAG COUNTER SPONGE SURGICOUNT (BAG) ×1 IMPLANT
BAG SPNG CNTER NS LX DISP (BAG) ×1
BASEPLATE AUG MED W-TAPER (Plate) IMPLANT
BEARING HUMERAL SHLDER 36M STD (Shoulder) IMPLANT
BIT DRILL 2.7 W/STOP DISP (BIT) IMPLANT
BIT DRILL TWIST 2.7 (BIT) IMPLANT
BLADE SAW SGTL 13X75X1.27 (BLADE) ×1 IMPLANT
BRNG HUM STD 36 RVRS SHLDR (Shoulder) ×1 IMPLANT
BSPLAT GLND MED AUG TPR ADPR (Plate) ×1 IMPLANT
CHLORAPREP W/TINT 26 (MISCELLANEOUS) ×2 IMPLANT
COOLER ICEMAN CLASSIC (MISCELLANEOUS) ×1 IMPLANT
COVER SURGICAL LIGHT HANDLE (MISCELLANEOUS) ×1 IMPLANT
DRAPE INCISE IOBAN 66X45 STRL (DRAPES) ×1 IMPLANT
DRAPE U-SHAPE 47X51 STRL (DRAPES) ×2 IMPLANT
DRSG AQUACEL AG ADV 3.5X10 (GAUZE/BANDAGES/DRESSINGS) ×1 IMPLANT
ELECT BLADE 4.0 EZ CLEAN MEGAD (MISCELLANEOUS) ×1
ELECT REM PT RETURN 9FT ADLT (ELECTROSURGICAL) ×1
ELECTRODE BLDE 4.0 EZ CLN MEGD (MISCELLANEOUS) IMPLANT
ELECTRODE REM PT RTRN 9FT ADLT (ELECTROSURGICAL) ×1 IMPLANT
GAUZE SPONGE 4X4 12PLY STRL LF (GAUZE/BANDAGES/DRESSINGS) ×1 IMPLANT
GLENOID SPHERE 36MM CVD +3 (Orthopedic Implant) IMPLANT
GLOVE BIOGEL PI IND STRL 7.0 (GLOVE) ×1 IMPLANT
GLOVE BIOGEL PI IND STRL 8 (GLOVE) ×1 IMPLANT
GLOVE ECLIPSE 7.0 STRL STRAW (GLOVE) ×1 IMPLANT
GLOVE ECLIPSE 8.0 STRL XLNG CF (GLOVE) ×1 IMPLANT
GOWN STRL REUS W/ TWL LRG LVL3 (GOWN DISPOSABLE) ×2 IMPLANT
GOWN STRL REUS W/ TWL XL LVL3 (GOWN DISPOSABLE) ×1 IMPLANT
GOWN STRL REUS W/TWL LRG LVL3 (GOWN DISPOSABLE) ×2
GOWN STRL REUS W/TWL XL LVL3 (GOWN DISPOSABLE) ×1
GUIDE MODEL REV SHLD RT (ORTHOPEDIC DISPOSABLE SUPPLIES) IMPLANT
HYDROGEN PEROXIDE 16OZ (MISCELLANEOUS) ×1 IMPLANT
JET LAVAGE IRRISEPT WOUND (IRRIGATION / IRRIGATOR) ×1
KIT BASIN OR (CUSTOM PROCEDURE TRAY) ×1 IMPLANT
KIT TURNOVER KIT B (KITS) ×1 IMPLANT
LAVAGE JET IRRISEPT WOUND (IRRIGATION / IRRIGATOR) ×1 IMPLANT
LOOP VESSEL MAXI BLUE (MISCELLANEOUS) ×1 IMPLANT
MANIFOLD NEPTUNE II (INSTRUMENTS) ×1 IMPLANT
NDL HYPO 25GX1X1/2 BEV (NEEDLE) IMPLANT
NDL SUT 6 .5 CRC .975X.05 MAYO (NEEDLE) ×1 IMPLANT
NEEDLE HYPO 25GX1X1/2 BEV (NEEDLE) IMPLANT
NEEDLE MAYO TAPER (NEEDLE) ×1
NOZZLE CEMENT SMALL (ORTHOPEDIC DISPOSABLE SUPPLIES) IMPLANT
NS IRRIG 1000ML POUR BTL (IV SOLUTION) ×1 IMPLANT
PACK SHOULDER (CUSTOM PROCEDURE TRAY) ×1 IMPLANT
PAD ARMBOARD 7.5X6 YLW CONV (MISCELLANEOUS) ×2 IMPLANT
PAD COLD SHLDR WRAP-ON (PAD) ×1 IMPLANT
PIN HUMERAL STMN 3.2MMX9IN (INSTRUMENTS) IMPLANT
PIN STEINMANN THREADED TIP (PIN) IMPLANT
PIN THREADED REVERSE (PIN) IMPLANT
REAMER GUIDE BUSHING SURG DISP (MISCELLANEOUS) IMPLANT
REAMER GUIDE W/SCREW AUG (MISCELLANEOUS) IMPLANT
RESTRAINT HEAD UNIVERSAL NS (MISCELLANEOUS) ×1 IMPLANT
RETRIEVER SUT HEWSON (MISCELLANEOUS) ×1 IMPLANT
SCREW BONE LOCKING 4.75X30X3.5 (Screw) IMPLANT
SCREW BONE STRL 6.5MMX30MM (Screw) IMPLANT
SCREW LOCKING 4.75MMX15MM (Screw) IMPLANT
SHOULDER HUMERAL BEAR 36M STD (Shoulder) ×1 IMPLANT
SLING ARM IMMOBILIZER LRG (SOFTGOODS) ×1 IMPLANT
SOL PREP POV-IOD 4OZ 10% (MISCELLANEOUS) ×1 IMPLANT
SPONGE T-LAP 18X18 ~~LOC~~+RFID (SPONGE) ×1 IMPLANT
STEM HUMERAL STRL 13MMX83MM (Stem) IMPLANT
STRIP CLOSURE SKIN 1/2X4 (GAUZE/BANDAGES/DRESSINGS) ×1 IMPLANT
SUCTION FRAZIER HANDLE 10FR (MISCELLANEOUS) ×1
SUCTION TUBE FRAZIER 10FR DISP (MISCELLANEOUS) ×1 IMPLANT
SUT BROADBAND TAPE 2PK 1.5 (SUTURE) IMPLANT
SUT FIBERWIRE #2 38 T-5 BLUE (SUTURE)
SUT MAXBRAID (SUTURE) IMPLANT
SUT MNCRL AB 3-0 PS2 18 (SUTURE) ×1 IMPLANT
SUT SILK 2 0 TIES 10X30 (SUTURE) ×1 IMPLANT
SUT VIC AB 0 CT1 27 (SUTURE) ×3
SUT VIC AB 0 CT1 27XBRD ANBCTR (SUTURE) ×2 IMPLANT
SUT VIC AB 1 CT1 27 (SUTURE) ×4
SUT VIC AB 1 CT1 27XBRD ANBCTR (SUTURE) ×1 IMPLANT
SUT VIC AB 2-0 CT1 27 (SUTURE) ×4
SUT VIC AB 2-0 CT1 TAPERPNT 27 (SUTURE) ×2 IMPLANT
SUT VICRYL 0 UR6 27IN ABS (SUTURE) ×2 IMPLANT
SUTURE FIBERWR #2 38 T-5 BLUE (SUTURE) IMPLANT
SUTURE TAPE 1.3 40 TPR END (SUTURE) IMPLANT
SUTURETAPE 1.3 40 TPR END (SUTURE)
SYR CONTROL 10ML LL (SYRINGE) IMPLANT
TOWEL GREEN STERILE (TOWEL DISPOSABLE) ×1 IMPLANT
TOWEL GREEN STERILE FF (TOWEL DISPOSABLE) ×1 IMPLANT
TRAY FOL W/BAG SLVR 16FR STRL (SET/KITS/TRAYS/PACK) IMPLANT
TRAY FOLEY W/BAG SLVR 16FR LF (SET/KITS/TRAYS/PACK)
TRAY HUM REV SHOULDER STD +6 (Shoulder) IMPLANT
WATER STERILE IRR 1000ML POUR (IV SOLUTION) ×1 IMPLANT

## 2022-04-10 NOTE — Anesthesia Procedure Notes (Signed)
Procedure Name: Intubation Date/Time: 04/10/2022 7:38 AM  Performed by: Michele Rockers, CRNAPre-anesthesia Checklist: Patient identified, Patient being monitored, Timeout performed, Emergency Drugs available and Suction available Patient Re-evaluated:Patient Re-evaluated prior to induction Oxygen Delivery Method: Circle System Utilized Preoxygenation: Pre-oxygenation with 100% oxygen Induction Type: IV induction Ventilation: Mask ventilation without difficulty Laryngoscope Size: Miller and 2 Grade View: Grade I Tube type: Oral Tube size: 7.0 mm Number of attempts: 1 Airway Equipment and Method: Stylet Placement Confirmation: ETT inserted through vocal cords under direct vision, positive ETCO2 and breath sounds checked- equal and bilateral Secured at: 21 cm Tube secured with: Tape Dental Injury: Teeth and Oropharynx as per pre-operative assessment

## 2022-04-10 NOTE — Anesthesia Procedure Notes (Signed)
Anesthesia Regional Block: Interscalene brachial plexus block   Pre-Anesthetic Checklist: , timeout performed,  Correct Patient, Correct Site, Correct Laterality,  Correct Procedure, Correct Position, site marked,  Risks and benefits discussed,  Surgical consent,  Pre-op evaluation,  At surgeon's request and post-op pain management  Laterality: Right  Prep: chloraprep       Needles:  Injection technique: Single-shot  Needle Type: Echogenic Stimulator Needle     Needle Length: 5cm  Needle Gauge: 22     Additional Needles:   Procedures:, nerve stimulator,,, ultrasound used (permanent image in chart),,     Nerve Stimulator or Paresthesia:  Response: hand, 0.45 mA  Additional Responses:   Narrative:  Start time: 04/10/2022 7:00 AM End time: 04/10/2022 7:10 AM Injection made incrementally with aspirations every 5 mL.  Performed by: Personally  Anesthesiologist: Janeece Riggers, MD  Additional Notes: Functioning IV was confirmed and monitors were applied.  A 7m 22ga Arrow echogenic stimulator needle was used. Sterile prep and drape,hand hygiene and sterile gloves were used. Ultrasound guidance: relevant anatomy identified, needle position confirmed, local anesthetic spread visualized around nerve(s)., vascular puncture avoided.  Image printed for medical record. Negative aspiration and negative test dose prior to incremental administration of local anesthetic. The patient tolerated the procedure well.

## 2022-04-10 NOTE — H&P (Signed)
Lindsay Jimenez is an 72 y.o. female.   Chief Complaint: Right shoulder pain HPI: Lindsay Jimenez is a 72 y.o. female who presents with bilateral shoulder pain right worse than left.  She had shoulder shoulders injected 11/04/2021.  Did get good relief for about 8 weeks.  She would like to have the right shoulder replaced.  She is right-hand dominant.  Reports pain with almost all activities of daily living.  She is going to [place] for 10 days at the end of June.  She is also gone from February 7 until the 17th.  Pain from the right shoulder wakes her from sleep at night..  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  Past Medical History:  Diagnosis Date   Arthritis    oa   Breast CA ([place]) 2002   bilateral   Family history of adverse reaction to anesthesia    aunt had problem , not sure what problem  was   GERD (gastroesophageal reflux disease)    occasional   PONV (postoperative nausea and vomiting)     Past Surgical History:  Procedure Laterality Date   ablation to both knees  april and may 2016   to deaden nerves   CATARACT EXTRACTION Bilateral 2022   COLONOSCOPY     KNEE SURGERY Right 02/11/1968   cartlidge removed   MASTECTOMY Bilateral 02/11/2000   OOPHORECTOMY Bilateral 02/10/2002   TOTAL HIP ARTHROPLASTY Right 10/20/2014   Procedure: RIGHT TOTAL HIP ARTHROPLASTY ANTERIOR APPROACH;  Surgeon: Mcarthur Rossetti, MD;  Location: [facility];  Service: Orthopedics;  Laterality: Right;   TOTAL KNEE ARTHROPLASTY Right 03/02/2015   Procedure: RIGHT TOTAL KNEE ARTHROPLASTY;  Surgeon: Mcarthur Rossetti, MD;  Location: [facility];  Service: Orthopedics;  Laterality: Right;   TOTAL KNEE ARTHROPLASTY Left 11/19/2018   Procedure: LEFT TOTAL KNEE ARTHROPLASTY;  Surgeon: Mcarthur Rossetti, MD;  Location: [facility];  Service: Orthopedics;  Laterality: Left;    History reviewed. No pertinent family history. Social History:  reports that she has  never smoked. She has never used smokeless tobacco. She reports that she does not drink alcohol and does not use drugs.  Allergies:  Allergies  Allergen Reactions   Restasis [Cyclosporine] Hives and Itching    Restasis eyedrops Rash, itching on hands Restasis eyedrops Rash, itching on hands   Xarelto [Rivaroxaban] Itching and Other (See Comments)   Codeine Other (See Comments)    Loopy, "out of it", weak   Latex Dermatitis   Red Dye Swelling   Tape Dermatitis    Tolerates paper tape, adhesive tape causes dermititis    Medications Prior to Admission  Medication Sig Dispense Refill   acetaminophen (TYLENOL) 500 MG tablet Take 500 mg by mouth in the morning and at bedtime.     Calcium Citrate-Vitamin D (CALCIUM CITRATE + D PO) Take 1 tablet by mouth 2 (two) times daily.     cetirizine (ZYRTEC) 10 MG tablet Take 10 mg by mouth daily as needed for allergies.     famotidine (PEPCID) 10 MG tablet Take 10 mg by mouth daily as needed for heartburn or indigestion.     fluticasone (FLONASE) 50 MCG/ACT nasal spray Place 1 spray into both nostrils daily as needed for allergies or rhinitis.     ibuprofen (ADVIL) 200 MG tablet Take 400 mg by mouth 2 (two) times daily.     methocarbamol (ROBAXIN) 500 MG tablet Take 1 tablet (500 mg total) by mouth every 6 (six) hours  as needed for muscle spasms. (Patient taking differently: Take 500 mg by mouth every 6 (six) hours as needed for muscle spasms. spasms) 60 tablet 0   Multiple Vitamin (MULTIVITAMIN WITH MINERALS) TABS tablet Take 1 tablet by mouth in the morning.     Probiotic Product (ALIGN) 4 MG CAPS Take 4 mg by mouth at bedtime.     TYRVAYA 0.03 MG/ACT SOLN Place 1 spray into both nostrils at bedtime.     diphenhydrAMINE (BENADRYL) 25 MG tablet Take 25 mg by mouth every 6 (six) hours as needed for allergies.     Misc. Devices MISC Breast prostheses, 1 pair, refill x 3.     Polyethyl Glycol-Propyl Glycol (SYSTANE OP) Place 1 drop into both eyes 4  (four) times daily as needed (for eye dryness).       Results for orders placed or performed during the hospital encounter of 04/08/22 (from the past 48 hour(s))  Surgical pcr screen     Status: Abnormal   Collection Time: 04/08/22  9:02 AM   Specimen: Nasal Mucosa; Nasal Swab  Result Value Ref Range   MRSA, PCR NEGATIVE NEGATIVE   Staphylococcus aureus POSITIVE (A) NEGATIVE    Comment: (NOTE) The Xpert SA Assay (FDA approved for NASAL specimens in patients 70 years of age and older), is one component of a comprehensive surveillance program. It is not intended to diagnose infection nor to guide or monitor treatment. Performed at [facility], [place] [address] [place], [place] [address]   CBC     Status: None   Collection Time: 04/08/22  9:02 AM  Result Value Ref Range   WBC 7.4 4.0 - 10.5 K/uL   RBC 4.71 3.87 - 5.11 MIL/uL   Hemoglobin 14.5 12.0 - 15.0 g/dL   HCT 42.5 36.0 - 46.0 %   MCV 90.2 80.0 - 100.0 fL   MCH 30.8 26.0 - 34.0 pg   MCHC 34.1 30.0 - 36.0 g/dL   RDW 12.5 11.5 - 15.5 %   Platelets 256 150 - 400 K/uL   nRBC 0.0 0.0 - 0.2 %    Comment: Performed at [facility], [place] [address] [place], [place] [id number]  Basic metabolic panel     Status: None   Collection Time: 04/08/22  9:02 AM  Result Value Ref Range   Sodium 139 135 - 145 mmol/L   Potassium 3.9 3.5 - 5.1 mmol/L   Chloride 102 98 - 111 mmol/L   CO2 27 22 - 32 mmol/L   Glucose, Bld 91 70 - 99 mg/dL    Comment: Glucose reference range applies only to samples taken after fasting for at least 8 hours.   BUN 14 8 - 23 mg/dL   Creatinine, Ser 0.76 0.44 - 1.00 mg/dL   Calcium 9.9 8.9 - 10.3 mg/dL   GFR, Estimated >60 >60 mL/min    Comment: (NOTE) Calculated using the CKD-EPI Creatinine Equation (2021)    Anion gap 10 5 - 15    Comment: Performed at [facility] [address], [place], [place] [address]  Urine Culture (for pregnant, neutropenic or urologic patients or patients with an  indwelling urinary catheter)     Status: None   Collection Time: 04/08/22  9:18 AM   Specimen: Urine, Clean Catch  Result Value Ref Range   Specimen Description URINE, CLEAN CATCH    Special Requests NONE    Culture      NO GROWTH Performed at [facility], [place] [address] [place], [place] [address]    Report Status 04/09/2022 FINAL   Urinalysis, Routine w reflex microscopic -Urine, Clean Catch     Status: None   Collection Time: 04/08/22  9:40 AM  Result Value Ref Range   Color, Urine YELLOW YELLOW   APPearance CLEAR CLEAR   Specific Gravity, Urine 1.010 1.005 - 1.030   pH 7.0 5.0 - 8.0   Glucose, UA NEGATIVE NEGATIVE mg/dL   Hgb urine dipstick NEGATIVE NEGATIVE   Bilirubin Urine NEGATIVE NEGATIVE   Ketones, ur NEGATIVE NEGATIVE mg/dL   Protein, ur NEGATIVE NEGATIVE mg/dL   Nitrite NEGATIVE NEGATIVE   Leukocytes,Ua NEGATIVE NEGATIVE    Comment: Microscopic not done on urines with negative protein, blood, leukocytes, nitrite, or glucose < 500 mg/dL. Performed at [facility], [place] [address], [place], [place] [address]    No results found.  Review of Systems  Musculoskeletal:  Positive for arthralgias.  All other systems reviewed and are negative.   Blood pressure (!) 161/86, pulse 94, temperature 98.6 F (37 C), temperature source Oral, resp. rate 18, height '5\' 8"'$  (1.727 m), weight 92.5 kg, SpO2 94 %. Physical Exam Vitals reviewed.  HENT:     Head: Normocephalic.     Nose: Nose normal.     Mouth/Throat:     Mouth: Mucous membranes are moist.  Eyes:     Pupils: Pupils are equal, round, and reactive to light.  Cardiovascular:     Rate and Rhythm: Normal rate.     Pulses: Normal pulses.  Pulmonary:     Effort: Pulmonary effort is normal.  Abdominal:     General: Abdomen is flat.  Musculoskeletal:     Cervical back: Normal range of motion.  Skin:    General: Skin is warm.     Capillary Refill: Capillary refill takes less than 2 seconds.   Neurological:     General: No focal deficit present.     Mental Status: She is alert.  Psychiatric:        Mood and Affect: Mood normal.    Ortho exam demonstrates good deltoid function bilaterally. On the right her range of motion is 20/80/110. Range of motion on the left is 30/95/130. She has good subscap strength bilaterally. A lot of coarseness and grinding on the right with passive range of motion. Radial pulse intact bilaterally,, skin intact in the right shoulder girdle region.  Assessment/Plan Impression is right shoulder arthritis.  Plan is right reverse shoulder replacement with biceps tenodesis..  Mild glenoid deformity is present.  Patient has limitation of range of motion as well as pain which interferes with activities of daily living as well as pain which wakes her from sleep at night.  The risk and benefits of shoulder replacement are discussed with the patient including not limited to infection nerve and vessel damage instability loosening as well as potential need for revision surgery.  Patient understands risk and benefits and wishes to proceed.  All questions answered.  The expected nature of the rehabilitative process also discussed.  Anderson Malta, MD 04/10/2022, 7:16 AM

## 2022-04-10 NOTE — Brief Op Note (Signed)
   04/10/2022  11:05 AM  PATIENT:  Lindsay Jimenez  72 y.o. female  PRE-OPERATIVE DIAGNOSIS:  right shoulder osteoarthritis, biceps tendonitis  POST-OPERATIVE DIAGNOSIS:  right shoulder osteoarthritis,biceps tendonitis  PROCEDURE:  Procedure(s): RIGHT SHOULDER REPLACEMENT, biceps tenodesis  SURGEON:  Surgeon(s): Meredith Pel, MD  ASSISTANT: magnant pa  ANESTHESIA:   general  EBL: 50 ml    Total I/O In: 1350 [I.V.:900; IV Piggyback:450] Out: 50 [Blood:50]  BLOOD ADMINISTERED: none  DRAINS: none   LOCAL MEDICATIONS USED:  vanco  SPECIMEN:  No Specimen  COUNTS:  YES  TOURNIQUET:  * No tourniquets in log *  DICTATION: .Other Dictation: Dictation Number [id number]  PLAN OF CARE: Admit for overnight observation  PATIENT DISPOSITION:  PACU - hemodynamically stable

## 2022-04-10 NOTE — Transfer of Care (Signed)
Immediate Anesthesia Transfer of Care Note  Patient: Lindsay Jimenez  Procedure(s) Performed: RIGHT SHOULDER REPLACEMENT (Right: Shoulder)  Patient Location: PACU  Anesthesia Type:GA combined with regional for post-op pain  Level of Consciousness: awake, alert , oriented, patient cooperative, and responds to stimulation  Airway & Oxygen Therapy: Patient Spontanous Breathing and Patient connected to nasal cannula oxygen  Post-op Assessment: Report given to RN, Post -op Vital signs reviewed and stable, and Patient moving all extremities X 4  Post vital signs: Reviewed and stable  Last Vitals:  Vitals Value Taken Time  BP 107/69 04/10/22 1107  Temp    Pulse 100 04/10/22 1110  Resp 17 04/10/22 1110  SpO2 97 % 04/10/22 1110  Vitals shown include unvalidated device data.  Last Pain:  Vitals:   04/10/22 0601  TempSrc:   PainSc: 4       Patients Stated Pain Goal: 0 ([id number])  Complications: No notable events documented.

## 2022-04-10 NOTE — Anesthesia Postprocedure Evaluation (Signed)
Anesthesia Post Note  Patient: Lindsay Jimenez  Procedure(s) Performed: RIGHT SHOULDER REPLACEMENT (Right: Shoulder)     Patient location during evaluation: PACU Anesthesia Type: Regional and General Level of consciousness: awake and alert Pain management: pain level controlled Vital Signs Assessment: post-procedure vital signs reviewed and stable Respiratory status: spontaneous breathing, nonlabored ventilation, respiratory function stable and patient connected to nasal cannula oxygen Cardiovascular status: blood pressure returned to baseline and stable Postop Assessment: no apparent nausea or vomiting Anesthetic complications: no  No notable events documented.  Last Vitals:  Vitals:   04/10/22 1145 04/10/22 1200  BP: 104/86 110/60  Pulse: 92 81  Resp: 19 (!) 9  Temp:    SpO2: 96% 92%    Last Pain:  Vitals:   04/10/22 1200  TempSrc:   PainSc: 4                  Laymon Stockert

## 2022-04-11 ENCOUNTER — Encounter (HOSPITAL_COMMUNITY): Payer: Self-pay | Admitting: Orthopedic Surgery

## 2022-04-11 DIAGNOSIS — M19011 Primary osteoarthritis, right shoulder: Secondary | ICD-10-CM | POA: Diagnosis not present

## 2022-04-11 MED ORDER — ASPIRIN 81 MG PO TBEC: 81.0000 mg | DELAYED_RELEASE_TABLET | Freq: Every day | ORAL | 0 refills | Status: DC

## 2022-04-11 MED ORDER — OXYCODONE HCL 5 MG PO TABS: 5.0000 mg | ORAL_TABLET | ORAL | 0 refills | Status: DC | PRN

## 2022-04-11 NOTE — Evaluation (Signed)
Occupational Therapy Evaluation Patient Details Name: Lindsay Jimenez MRN: [id number] DOB: 11/14/1950 Today's Date: 04/11/2022   History of Present Illness Pt is a 72 y.o. female with ongoing shoulder pain due to OA; now s/p R reverse shoulder replacement 2/29. PMH significant for OA, breast cancer, GERD, R THA, R and L TKA.   Clinical Impression   PTA, pt lived alone and was independent. Upon eval, pt performing UB ADL with min A and all other ALD with mod I. Pt educated and demonstrating use of compensatory techniques for sleep positioning, bed mobility, UB ADL, sling application and position, LB ADL within precautions. Pt performing pendulums and elbow/wrist/hand exercises as outlined in MD orders. Recommending discharge home with friends to help as needed. Follow physicians orders for follow up therapies.     Recommendations for follow up therapy are one component of a multi-disciplinary discharge planning process, led by the attending physician.  Recommendations may be updated based on patient status, additional functional criteria and insurance authorization.   Follow Up Recommendations  Follow physician's recommendations for discharge plan and follow up therapies     Assistance Recommended at Discharge PRN  Patient can return home with the following A little help with bathing/dressing/bathroom    Functional Status Assessment  Patient has had a recent decline in their functional status and demonstrates the ability to make significant improvements in function in a reasonable and predictable amount of time.  Equipment Recommendations  None recommended by OT    Recommendations for Other Services       Precautions / Restrictions Precautions Precautions: Shoulder Type of Shoulder Precautions: Ext rotation 0-30, otherwise no shoulder AROM, NWB, OK for elbow/wrist/hand exercises and pendulums. Sling at all times except ADL and exercise. Shoulder Interventions: Shoulder  sling/immobilizer;Off for dressing/bathing/exercises Precaution Booklet Issued: Yes (comment) Precaution Comments: All precautions reviewed within the context of ADL Required Braces or Orthoses: Sling Restrictions Weight Bearing Restrictions: Yes RUE Weight Bearing: Non weight bearing      Mobility Bed Mobility Overal bed mobility: Modified Independent             General bed mobility comments: reviewed compensatory techniques for bed mobility and sleep positioning. Pt has sleep number bed with elevating HOB, so able to turn out of bed to L without bearing weight onR    Transfers Overall transfer level: Modified independent                        Balance Overall balance assessment: Modified Independent                                         ADL either performed or assessed with clinical judgement   ADL Overall ADL's : Needs assistance/impaired Eating/Feeding: Modified independent;Sitting   Grooming: Modified independent;Standing   Upper Body Bathing: Minimal assistance;Sitting;Adhering to UE precautions   Lower Body Bathing: Modified independent;Sit to/from stand   Upper Body Dressing : Minimal assistance;Sitting Upper Body Dressing Details (indicate cue type and reason): Min A for bringing shirt around R elbow with LUE as well as maintaining neutral position; donning brace min A for positioning around elbow as well Lower Body Dressing: Modified independent;Sit to/from stand Lower Body Dressing Details (indicate cue type and reason): significantly increased time Toilet Transfer: Modified Independent;Ambulation Toilet Transfer Details (indicate cue type and reason): transfer to toilet during session Toileting- Clothing Manipulation and Hygiene:  Set up;Sitting/lateral lean Toileting - Clothing Manipulation Details (indicate cue type and reason): set-up due to pt unable to reach toilet paper dispenser from seated position Tub/ Shower Transfer:  Walk-in shower;Modified independent;Ambulation;Shower seat   Functional mobility during ADLs: Modified independent       Vision Baseline Vision/History: 1 Wears glasses Ability to See in Adequate Light: 0 Adequate Patient Visual Report: No change from baseline Vision Assessment?: No apparent visual deficits Additional Comments: reading along on handouts     Perception Perception Perception Tested?: No   Praxis Praxis Praxis tested?: Not tested    Pertinent Vitals/Pain Pain Assessment Pain Assessment: No/denies pain     Hand Dominance Right   Extremity/Trunk Assessment Upper Extremity Assessment Upper Extremity Assessment: RUE deficits/detail;LUE deficits/detail RUE Deficits / Details: shoulder precautions above. Unable to perofrm elbow flexion actively and able to supinate to ~15 degrees (nerve block still active) LUE Deficits / Details: decr shoulder mobility due to OA; able to reach head and FF to ~70 degrees. Pain with horizontal adduction   Lower Extremity Assessment Lower Extremity Assessment: Overall WFL for tasks assessed       Communication Communication Communication: No difficulties   Cognition Arousal/Alertness: Awake/alert Behavior During Therapy: WFL for tasks assessed/performed Overall Cognitive Status: Within Functional Limits for tasks assessed                                       General Comments  VSS    Exercises Exercises: Shoulder Shoulder Exercises Pendulum Exercise: PROM, 10 reps, Right, Seated, Standing Shoulder Flexion:  (not within orders for ROM) Shoulder Extension:  (not within orders for ROM) Shoulder ABduction:  (not within orders for ROM) Shoulder External Rotation:  (0-30, pt educated) Elbow Flexion: AAROM, 10 reps, Right, Seated Elbow Extension: AAROM, 10 reps, Seated, Right Wrist Flexion: AROM, Right, 10 reps, Seated Wrist Extension: AROM, Right, 10 reps, Seated Digit Composite Flexion: AROM, Right, 10 reps,  Seated Composite Extension: AROM, Right, 10 reps, Seated Neck Flexion: AROM, 5 reps, Seated Neck Extension: AROM, 5 reps, Seated Neck Lateral Flexion - Right: AROM, 5 reps, Seated Neck Lateral Flexion - Left: AROM, 10 reps, Seated   Shoulder Instructions Shoulder Instructions Donning/doffing shirt without moving shoulder: Minimal assistance;Patient able to independently direct caregiver Method for sponge bathing under operated UE: Modified independent Donning/doffing sling/immobilizer: Minimal assistance;Patient able to independently direct caregiver Correct positioning of sling/immobilizer: Minimal assistance;Patient able to independently direct caregiver Pendulum exercises (written home exercise program): Supervision/safety (for technique) ROM for elbow, wrist and digits of operated UE: Modified independent Sling wearing schedule (on at all times/off for ADL's): Modified independent Proper positioning of operated UE when showering: Modified independent Positioning of UE while sleeping: Modified independent    Home Living Family/patient expects to be discharged to:: Private residence Living Arrangements: Alone Available Help at Discharge: Family;Friend(s);Available 24 hours/day;Available PRN/intermittently (Friend to stay for one week) Type of Home: House Home Access: Stairs to enter [employer] of Steps: 2 Entrance Stairs-Rails: None Home Layout: One level;Able to live on main level with bedroom/bathroom (has bonus room upstairs)     Bathroom Shower/Tub: Occupational psychologist: Handicapped height Bathroom Accessibility: Yes How Accessible: Accessible via wheelchair;Accessible via walker Home Equipment: [place] (2 wheels);BSC/3in1;Shower seat - built in;Grab bars - tub/shower;Hand held shower head          Prior Functioning/Environment Prior Level of Function : Independent/Modified Independent;Driving  Mobility Comments: no  AD ADLs Comments: independent in ADL and IADL        OT Problem List: Decreased strength;Impaired balance (sitting and/or standing);Decreased range of motion;Decreased coordination;Decreased knowledge of precautions;Impaired UE functional use      OT Treatment/Interventions:      OT Goals(Current goals can be found in the care plan section) Acute Rehab OT Goals Patient Stated Goal: get better OT Goal Formulation: With patient  OT Frequency:      Co-evaluation              AM-PAC OT "6 Clicks" Daily Activity     Outcome Measure Help from another person eating meals?: None Help from another person taking care of personal grooming?: None Help from another person toileting, which includes using toliet, bedpan, or urinal?: None Help from another person bathing (including washing, rinsing, drying)?: None Help from another person to put on and taking off regular upper body clothing?: A Little Help from another person to put on and taking off regular lower body clothing?: None 6 Click Score: 23   End of Session Equipment Utilized During Treatment: Other (comment) (shoulder immobilizer) Nurse Communication: Mobility status  Activity Tolerance: Patient tolerated treatment well Patient left: in bed;with call bell/phone within reach  OT Visit Diagnosis: Unsteadiness on feet (R26.81);Muscle weakness (generalized) (M62.81)                Time: [id number] OT Time Calculation (min): 50 min Charges:  OT General Charges $OT Visit: 1 Visit OT Evaluation $OT Eval Low Complexity: 1 Low OT Treatments $Self Care/Home Management : 23-37 mins  Elder Cyphers, OTR/L [facility] Acute Rehabilitation Office: [phone]  Magnus Ivan 04/11/2022, 9:10 AM

## 2022-04-11 NOTE — Progress Notes (Signed)
Pt alert and oriented, arrived from PACU last evening PT has iceman, glasses and cellphone Pt friendly and talkative to staff. Dressing intact right shoulder with scant amount drainage, Ice man. Able to ambulate with standby. No s/sx distress, no changes nothed this shift.

## 2022-04-11 NOTE — Progress Notes (Signed)
  Subjective: Lindsay Jimenez is a 72 y.o. female s/p right RSA.  They are POD 1.  Pt's pain is controlled.  Patient denies any complaints of chest pain, shortness of breath, abdominal pain.  Block still in effect.  Patient has been ambulatory around her room without any dizziness or lightheadedness.  She has CPM machine at home.  Objective: Vital signs in last 24 hours: Temp:  [97.7 F (36.5 C)-98.7 F (37.1 C)] 98.7 F (37.1 C) (03/01 0200) Pulse Rate:  [81-100] 82 (03/01 0200) Resp:  [9-22] 20 (03/01 0200) BP: (101-128)/(59-86) 117/59 (03/01 0200) SpO2:  [92 %-98 %] 97 % (03/01 0200) Weight:  [90.3 kg] 90.3 kg (02/29 1948)  Intake/Output from previous day: 02/29 0701 - 03/01 0700 In: [id number] [P.O.:240; I.V.:900; IV Piggyback:450] Out: 50 [Blood:50] Intake/Output this shift: No intake/output data recorded.  Exam:  No gross blood or drainage overlying the dressing 2+ radial pulse of the operative extremity Postoperative physical exam somewhat limited by interscalene block but intact EPL, FPL, finger abduction, wrist extension, grip strength, pronation/supination.  She has continued weakness with bicep flexion, tricep extension, deltoid   Labs: Recent Labs    04/08/22 0902  HGB 14.5   Recent Labs    04/08/22 0902  WBC 7.4  RBC 4.71  HCT 42.5  PLT 256   Recent Labs    04/08/22 0902  NA 139  K 3.9  CL 102  CO2 27  BUN 14  CREATININE 0.76  GLUCOSE 91  CALCIUM 9.9   No results for input(s): "LABPT", "INR" in the last 72 hours.  Assessment/Plan: Pt is POD 1 s/p right RSA    -Plan to discharge to home today after patient works with occupational therapy  -No lifting with the operative arm  -Stay in sling except for showering/sleeping and using CPM machine at home.  No lifting with the operative arm   -Follow-up with Dr. Marlou Sa in clinic 2 weeks postoperatively     [facility] 04/11/2022, 8:01 AM

## 2022-04-11 NOTE — Op Note (Unsigned)
NAME: Lindsay Jimenez, Lindsay Jimenez MEDICAL RECORD NO: [id number] ACCOUNT NO: [id number] DATE OF BIRTH: 07-Oct-1950 FACILITY: [facility] LOCATION: [facility] PHYSICIAN: Yetta Barre. Marlou Sa, MD  Operative Report   DATE OF PROCEDURE: 04/10/2022  PREOPERATIVE DIAGNOSIS:  Right shoulder arthritis and biceps tendinitis.  POSTOPERATIVE DIAGNOSIS:  Right shoulder arthritis and biceps tendinitis.  PROCEDURE:  Right shoulder reverse shoulder replacement using [employer] shoulder replacement including medium augmented baseplate with 1 central compression screw and four peripheral locking screws with 36+3 glenosphere, size 13 mini stem with +6  offset humeral tray and 36 standard thickness poly.  SURGEON:  Meredith Pel, MD  ASSISTANT:  Annie Main, PA.  INDICATIONS:  The patient is a 72 year old patient with severe end-stage right shoulder arthritis and rotator cuff tendinitis who presents for operative management after explanation of risks and benefits.  DESCRIPTION OF PROCEDURE:  The patient was brought to the operating room where general endotracheal anesthesia was induced.  Preoperative antibiotics administered.  The patient was placed in the beach chair position with the head in neutral position.   Right shoulder, arm and hand prescrubbed with hydrogen peroxide followed by alcohol and then Betadine, which was allowed to air dry, then prepped with ChloraPrep solution and draped in sterile manner.  Ioban used to seal the operative field.  After  calling timeout, preoperative IV antibiotics had been administered.  TXA also administered.  Deltopectoral approach was made.  Skin and subcutaneous tissue sharply divided.  Irrisept solution utilized after the skin incision.  Cephalic vein mobilized  medially.  One branch of the cephalic vein was avulsed, which required ligation of the cephalic vein.  Deltopectoral interval was developed.  Subdeltoid and subacromial adhesions were manually reduced.  Anterior portion of the  deltoid was then elevated  manually to facilitate exposure.  Next, the Kolbel retractor was placed.  Axillary nerve visualized and palpated and protected at all times during the case.  Next, the circumflex vessels were ligated.  Biceps tendon was then tenodesed to the pec tendon,  which was released about 1.5 cm.  This was done with five 0 Vicryl sutures.  Biceps tendon was then truncated and brought proximally with the rotator interval being opened all the way to the base of the coracoid.  Next, the subscapularis was tagged with  2-0 Vicryl suture, and released using a 15 blade off the lesser tuberosity.  Release was completed using electrocautery down along the inferior humeral neck about 2 cm.  Progressive capsular release was performed around to the 5 o'clock position.  The  rotator cuff was intact, but there was significant tendinitis of the supraspinatus tendon.  At this time, the head was dislocated.  Rongeur was used to create a starting point about 1 cm posterior to the bicipital groove and the top of the humeral head  tuberosity interface.  Reaming was performed up to size 13.  The head was then cut in 30 degrees of retroversion, which was approximately equal to her native version. The humerus was then broached up to size 13 and a cap was placed.  Posterior retractor  was then placed and an anterior retractor was placed carefully along the anterior glenoid rim.  Circumferential excision of the biceps tendon and the labrum was performed with care being taken to avoid injury to the underlying neurovascular structures.   The patient-specific guide was placed and the guide pins were placed.  Correct placement of the pins was confirmed visually comparing to the model.  Reaming was then performed the prescribed  depth of 6-1/2 mm.  Augment reaming was then performed using  the augmented reamer.  The trial was then placed and had very good contact.  Next, the guide pin was removed and the glenoid baseplate  was placed with very good fixation achieved with 1 central compression screw and 4 peripheral locking screws.  Overall,  bone quality was excellent.  Next, a trial reduction was performed with both the 36 standard and 36+3 glenosphere.  Also, reduction was performed using the +6 humeral tray offset with standard bearing.  At this time, the +3 glenosphere with the +6  offset humeral tray and standard bearing gave excellent stability.  This was with adduction and forward flexion along with full range of motion with internal and external rotation at 90 degrees of abduction.  Trial components were removed.  The true  glenosphere was placed with 1.5 mm inferior offset.  Next, the true stem was placed after placing 6 suture tapes through the lesser tuberosity for later repair of the subscapularis tendon.  It should be noted that Irrisept solution was utilized to  irrigate out the humeral canal and then we placed vancomycin powder in the canal. True stem was placed and reduction was again performed with the trial reduction components of the +6 humeral offset tray and the standard bearing liner.  Same stability  parameters were maintained.  True implants placed on the humeral side.  Thorough irrigation was performed.  Same stability parameters were maintained.  Axillary nerve palpated and found to be intact and under appropriate tension.  Next, thorough  irrigation was performed with 3 liters of pouring irrigation.  The subscap was then reattached to the lesser tuberosity using the 6 SutureTapes and Nice knots.  This was done with the arm in 30 degrees of external rotation.  Next, Irrisept solution then  used to irrigate out the joint and then vancomycin powder was placed on the prosthetic.  Rotator interval was then closed with the arm in 30 degrees of external rotation using #1 Vicryl suture.  More irrigation was then utilized and then Irrisept  solution utilized and Vancomycin powder placed on top of the closure.   Deltopectoral interval was then closed using #1 Vicryl suture followed by interrupted inverted 0 Vicryl suture, 2-0 Vicryl suture, and 3-0 Monocryl with Steri-Strips, Aquacel  dressing, and shoulder immobilizer placed.  The patient tolerated the procedure well without immediate complications.  Transferred to the recovery room in stable condition.  Luke's assistance was required at all times for retraction, opening, closing,  mobilization of tissue.  His assistance was a medical necessity.      PAA D: 04/10/2022 11:14:18 am T: 04/10/2022 12:03:00 pm  JOB: [id number]

## 2022-04-11 NOTE — Progress Notes (Signed)
Physical Therapy Note  Spoke with occupational therapy after their initial evaluation. OT reports patient is functioning at a high level of independence and no physical therapy is indicated at this time. PT is signing-off. Please re-order if there is any significant change in status. Thank you for this referral.  Candie Mile, PT, DPT Physical Therapist [facility]

## 2022-04-11 NOTE — Progress Notes (Signed)
Discharge instructions given. Patient verbalized understanding and all questions were answered.  

## 2022-04-15 NOTE — Telephone Encounter (Signed)
Okay for this referral. I can fill out RX for PT to fax tomorrow if you want. Just send this message back to me if that's what you want me to do

## 2022-04-22 ENCOUNTER — Telehealth: Payer: Self-pay | Admitting: Surgical

## 2022-04-22 NOTE — Telephone Encounter (Signed)
Outpatient Rehab is asking what (RSA) stands for please call ---[phone]

## 2022-04-22 NOTE — Telephone Encounter (Signed)
PT aware Reverse Shoulder Arthroplasty

## 2022-04-24 NOTE — Discharge Summary (Signed)
Physician Discharge Summary      Patient ID: Lindsay Jimenez MRN: [id number] DOB/AGE: 08-02-1950 72 y.o.  Admit date: 04/10/2022 Discharge date: 04/11/2022  Admission Diagnoses:  Principal Problem:   S/P reverse total shoulder arthroplasty, right   Discharge Diagnoses:  Same  Surgeries: Procedure(s): RIGHT SHOULDER REPLACEMENT on 04/10/2022   Consultants:   Discharged Condition: Stable  Hospital Course: Lindsay Jimenez is an 72 y.o. female who was admitted 04/10/2022 with a chief complaint of right shoulder pain, and found to have a diagnosis of right shoulder osteoarthritis.  They were brought to the operating room on 04/10/2022 and underwent the above named procedures.  Pt awoke from anesthesia without complication and was transferred to the floor. On POD1, patient's pain was controlled.  She was able to mobilize well with occupational therapy and around her room.  No red flag symptoms.  She was discharged home on POD 1..  Pt will f/u with Dr. Marlou Sa in clinic in ~2 weeks.   Antibiotics given:  Anti-infectives (From admission, onward)    Start     Dose/Rate Route Frequency Ordered Stop   04/10/22 1537  ceFAZolin (ANCEF) 2-4 GM/100ML-% IVPB       Note to Pharmacy: Julio Alm: cabinet override      04/10/22 1537 04/10/22 1815   04/10/22 1400  ceFAZolin (ANCEF) IVPB 2g/100 mL premix        2 g 200 mL/hr over 30 Minutes Intravenous Every 8 hours 04/10/22 1318 04/11/22 0556   04/10/22 0841  vancomycin (VANCOCIN) powder  Status:  Discontinued          As needed 04/10/22 0841 04/10/22 1100   04/10/22 0600  ceFAZolin (ANCEF) IVPB 2g/100 mL premix        2 g 200 mL/hr over 30 Minutes Intravenous On call to O.R. 04/10/22 [id number] 04/10/22 0800     .  Recent vital signs:  Vitals:   04/11/22 0200 04/11/22 0915  BP: (!) 117/59 121/60  Pulse: 82 87  Resp: 20 17  Temp: 98.7 F (37.1 C) 98.8 F (37.1 C)  SpO2: 97% 95%    Recent laboratory studies:  Results for orders placed  or performed during the hospital encounter of 04/08/22  Surgical pcr screen   Specimen: Nasal Mucosa; Nasal Swab  Result Value Ref Range   MRSA, PCR NEGATIVE NEGATIVE   Staphylococcus aureus POSITIVE (A) NEGATIVE  Urine Culture (for pregnant, neutropenic or urologic patients or patients with an indwelling urinary catheter)   Specimen: Urine, Clean Catch  Result Value Ref Range   Specimen Description URINE, CLEAN CATCH    Special Requests NONE    Culture      NO GROWTH Performed at [facility], [place] [address], [place],  [address]    Report Status 04/09/2022 FINAL   CBC  Result Value Ref Range   WBC 7.4 4.0 - 10.5 K/uL   RBC 4.71 3.87 - 5.11 MIL/uL   Hemoglobin 14.5 12.0 - 15.0 g/dL   HCT 42.5 36.0 - 46.0 %   MCV 90.2 80.0 - 100.0 fL   MCH 30.8 26.0 - 34.0 pg   MCHC 34.1 30.0 - 36.0 g/dL   RDW 12.5 11.5 - 15.5 %   Platelets 256 150 - 400 K/uL   nRBC 0.0 0.0 - 0.2 %  Basic metabolic panel  Result Value Ref Range   Sodium 139 135 - 145 mmol/L   Potassium 3.9 3.5 - 5.1 mmol/L   Chloride 102 98 -  111 mmol/L   CO2 27 22 - 32 mmol/L   Glucose, Bld 91 70 - 99 mg/dL   BUN 14 8 - 23 mg/dL   Creatinine, Ser 0.76 0.44 - 1.00 mg/dL   Calcium 9.9 8.9 - 10.3 mg/dL   GFR, Estimated >60 >60 mL/min   Anion gap 10 5 - 15  Urinalysis, Routine w reflex microscopic -Urine, Clean Catch  Result Value Ref Range   Color, Urine YELLOW YELLOW   APPearance CLEAR CLEAR   Specific Gravity, Urine 1.010 1.005 - 1.030   pH 7.0 5.0 - 8.0   Glucose, UA NEGATIVE NEGATIVE mg/dL   Hgb urine dipstick NEGATIVE NEGATIVE   Bilirubin Urine NEGATIVE NEGATIVE   Ketones, ur NEGATIVE NEGATIVE mg/dL   Protein, ur NEGATIVE NEGATIVE mg/dL   Nitrite NEGATIVE NEGATIVE   Leukocytes,Ua NEGATIVE NEGATIVE    Discharge Medications:   Allergies as of 04/11/2022       Reactions   Restasis [cyclosporine] Hives, Itching   Restasis eyedrops Rash, itching on hands Restasis eyedrops Rash, itching on hands    Xarelto [rivaroxaban] Itching, Other (See Comments)   Codeine Other (See Comments)   Loopy, "out of it", weak   Latex Dermatitis   Red Dye Swelling   Tape Dermatitis   Tolerates paper tape, adhesive tape causes dermititis        Medication List     TAKE these medications    acetaminophen 500 MG tablet Commonly known as: TYLENOL Take 500 mg by mouth in the morning and at bedtime.   Align 4 MG Caps Take 4 mg by mouth at bedtime.   aspirin EC 81 MG tablet Take 1 tablet (81 mg total) by mouth daily. Swallow whole.   CALCIUM CITRATE + D PO Take 1 tablet by mouth 2 (two) times daily.   cetirizine 10 MG tablet Commonly known as: ZYRTEC Take 10 mg by mouth daily as needed for allergies.   diphenhydrAMINE 25 MG tablet Commonly known as: BENADRYL Take 25 mg by mouth every 6 (six) hours as needed for allergies.   famotidine 10 MG tablet Commonly known as: PEPCID Take 10 mg by mouth daily as needed for heartburn or indigestion.   fluticasone 50 MCG/ACT nasal spray Commonly known as: FLONASE Place 1 spray into both nostrils daily as needed for allergies or rhinitis.   ibuprofen 200 MG tablet Commonly known as: ADVIL Take 400 mg by mouth 2 (two) times daily.   methocarbamol 500 MG tablet Commonly known as: ROBAXIN Take 1 tablet (500 mg total) by mouth every 6 (six) hours as needed for muscle spasms. What changed: additional instructions   Misc. Devices Misc Breast prostheses, 1 pair, refill x 3.   multivitamin with minerals Tabs tablet Take 1 tablet by mouth in the morning.   oxyCODONE 5 MG immediate release tablet Commonly known as: Oxy IR/ROXICODONE Take 1 tablet (5 mg total) by mouth every 4 (four) hours as needed for moderate pain (pain score 4-6).   SYSTANE OP Place 1 drop into both eyes 4 (four) times daily as needed (for eye dryness).   Tyrvaya 0.03 MG/ACT Soln Generic drug: Varenicline Tartrate Place 1 spray into both nostrils at bedtime.         Diagnostic Studies: DG Shoulder Right Port  Result Date: 04/10/2022 CLINICAL DATA:  Status post shoulder surgery EXAM: RIGHT SHOULDER - 3 VIEW portable COMPARISON:  Preop x-ray 04/03/2021 FINDINGS: Surgical changes of reverse shoulder arthroplasty with screw fixated component and Press-Fit humeral component. Expected  alignment. Slight osteophyte formation along the AC joint. Overlapping soft tissue gas. No hardware failure. Imaging was obtained to aid in treatment IMPRESSION: Acute surgical changes of right shoulder reverse arthroplasty Electronically Signed   By: Jill Side M.D.   On: 04/10/2022 12:28    Disposition: Discharge disposition: 01-Home or Self Care       Discharge Instructions     Call MD / Call 911   Complete by: As directed    If you experience chest pain or shortness of breath, CALL 911 and be transported to the hospital emergency room.  If you develope a fever above 101 F, pus (white drainage) or increased drainage or redness at the wound, or calf pain, call your surgeon's office.   Constipation Prevention   Complete by: As directed    Drink plenty of fluids.  Prune juice may be helpful.  You may use a stool softener, such as Colace (over the counter) 100 mg twice a day.  Use MiraLax (over the counter) for constipation as needed.   Diet - low sodium heart healthy   Complete by: As directed    Discharge instructions   Complete by: As directed    You may shower, dressing is waterproof.  Do not bathe or soak the operative shoulder in a tub, pool.  Use the CPM machine 3 times a day for one hour each time.  No lifting with the operative shoulder. Continue use of the sling.  Follow-up with Dr. Marlou Sa in ~2 weeks on your given appointment date.  We will remove your adhesive bandage at that time.    Dental Antibiotics:  In most cases prophylactic antibiotics for Dental procdeures after total joint surgery are not necessary.  Exceptions are as follows:  1. History of  prior total joint infection  2. Severely immunocompromised (Organ Transplant, cancer chemotherapy, Rheumatoid biologic meds such as [place])  3. Poorly controlled diabetes (A1C &gt; 8.0, blood glucose over 200)  If you have one of these conditions, contact your surgeon for an antibiotic prescription, prior to your dental procedure.   Increase activity slowly as tolerated   Complete by: As directed    Post-operative opioid taper instructions:   Complete by: As directed    POST-OPERATIVE OPIOID TAPER INSTRUCTIONS: It is important to wean off of your opioid medication as soon as possible. If you do not need pain medication after your surgery it is ok to stop day one. Opioids include: Codeine, Hydrocodone(Norco, Vicodin), Oxycodone(Percocet, oxycontin) and hydromorphone amongst others.  Long term and even short term use of opiods can cause: Increased pain response Dependence Constipation Depression Respiratory depression And more.  Withdrawal symptoms can include Flu like symptoms Nausea, vomiting And more Techniques to manage these symptoms Hydrate well Eat regular healthy meals Stay active Use relaxation techniques(deep breathing, meditating, yoga) Do Not substitute Alcohol to help with tapering If you have been on opioids for less than two weeks and do not have pain than it is ok to stop all together.  Plan to wean off of opioids This plan should start within one week post op of your joint replacement. Maintain the same interval or time between taking each dose and first decrease the dose.  Cut the total daily intake of opioids by one tablet each day Next start to increase the time between doses. The last dose that should be eliminated is the evening dose.           Follow-up Information     [facility] Follow up on 04/25/2022.   Why: post hospital followup scheduled for 04/25/2022 AT 11:00 am with Dr. Gita Kudo information: [facility] [address] [place], [place] [address] [phone]                 Signed: Annie Main 04/24/2022, 10:59 AM

## 2022-04-25 ENCOUNTER — Encounter: Payer: Self-pay | Admitting: Surgical

## 2022-04-25 ENCOUNTER — Ambulatory Visit (INDEPENDENT_AMBULATORY_CARE_PROVIDER_SITE_OTHER): Payer: Medicare PPO | Admitting: Surgical

## 2022-04-25 ENCOUNTER — Other Ambulatory Visit (INDEPENDENT_AMBULATORY_CARE_PROVIDER_SITE_OTHER): Payer: Medicare PPO

## 2022-04-25 DIAGNOSIS — Z96611 Presence of right artificial shoulder joint: Secondary | ICD-10-CM

## 2022-04-25 NOTE — Progress Notes (Signed)
Post-Op Visit Note   Patient: Lindsay Jimenez           Date of Birth: 05-May-1950           MRN: [id number] Visit Date: 04/25/2022 PCP: Pcp, No   Assessment & Plan:  Chief Complaint:  Chief Complaint  Patient presents with   Right Shoulder - Routine Post Op   Visit Diagnoses:  1. History of arthroplasty of right shoulder     Plan: Lindsay Jimenez is a 72 y.o. female who presents s/p right reverse shoulder arthroplasty on 04/10/2022.  Patient is doing well and pain is overall controlled.  Using CPM machine 3 times daily for 15 minutes each session.  Denies any chest pain, SOB, fevers, chills.  No complaint of any instability symptoms.  States that she has really no pain, just occasional discomfort.  Not taking any medication for pain.    On exam, patient has range of motion 10 degrees X rotation, 80 degrees abduction, 100 degrees forward elevation passively.  Intact EPL, FPL, finger abduction, finger adduction, pronation/supination, bicep, tricep, deltoid of operative extremity.  Axillary nerve intact with deltoid firing.  Incision is healing well without evidence of infection or dehiscence.  Incision was made sure to be covered with Steri-Strips from the proximal to distal aspect of the length of the incision.  2+ radial pulse of the operative extremity  Plan is discontinue sling.  Okay to very lightly lift with the operative extremity but no lifting anything heavier than a coffee cup or cell phone.  Start physical therapy to focus on passive range of motion and active range of motion with deltoid isometrics.  She was already set up to start physical therapy and [facility] on Monday.  Do not want to externally rotate past 30 degrees to protect subscapularis repair.  Follow-up in 4 weeks for clinical recheck.    Follow-Up Instructions: No follow-ups on file.   Orders:  Orders Placed This Encounter  Procedures   XR Shoulder Right   No orders of the defined types were placed in this  encounter.   Imaging: No results found.  PMFS History: Patient Active Problem List   Diagnosis Date Noted   S/P reverse total shoulder arthroplasty, right 04/10/2022   Status post total left knee replacement 11/19/2018   Unilateral primary osteoarthritis, left knee 08/21/2016   Chronic pain of left knee 08/21/2016   Osteoarthritis of right knee 03/02/2015   Status post total right knee replacement 03/02/2015   Osteoarthritis of right hip 10/20/2014   Status post total replacement of right hip 10/20/2014   Past Medical History:  Diagnosis Date   Arthritis    oa   Breast CA ([place]) 2002   bilateral   Family history of adverse reaction to anesthesia    aunt had problem , not sure what problem  was   GERD (gastroesophageal reflux disease)    occasional   PONV (postoperative nausea and vomiting)     No family history on file.  Past Surgical History:  Procedure Laterality Date   ablation to both knees  april and may 2016   to deaden nerves   CATARACT EXTRACTION Bilateral 2022   COLONOSCOPY     KNEE SURGERY Right 02/11/1968   cartlidge removed   MASTECTOMY Bilateral 02/11/2000   OOPHORECTOMY Bilateral 02/10/2002   TOTAL HIP ARTHROPLASTY Right 10/20/2014   Procedure: RIGHT TOTAL HIP ARTHROPLASTY ANTERIOR APPROACH;  Surgeon: Mcarthur Rossetti, MD;  Location: [facility];  Service: Orthopedics;  Laterality: Right;   TOTAL KNEE ARTHROPLASTY Right 03/02/2015   Procedure: RIGHT TOTAL KNEE ARTHROPLASTY;  Surgeon: Mcarthur Rossetti, MD;  Location: [facility];  Service: Orthopedics;  Laterality: Right;   TOTAL KNEE ARTHROPLASTY Left 11/19/2018   Procedure: LEFT TOTAL KNEE ARTHROPLASTY;  Surgeon: Mcarthur Rossetti, MD;  Location: [facility];  Service: Orthopedics;  Laterality: Left;   TOTAL SHOULDER ARTHROPLASTY Right 04/10/2022   Procedure: RIGHT SHOULDER REPLACEMENT;  Surgeon: Meredith Pel, MD;  Location: [facility];  Service: Orthopedics;  Laterality: Right;   Social History    Occupational History   Not on file  Tobacco Use   Smoking status: Never   Smokeless tobacco: Never  Vaping Use   Vaping Use: Never used  Substance and Sexual Activity   Alcohol use: No   Drug use: No   Sexual activity: Yes

## 2022-05-11 DIAGNOSIS — M19011 Primary osteoarthritis, right shoulder: Secondary | ICD-10-CM

## 2022-05-11 DIAGNOSIS — M7521 Bicipital tendinitis, right shoulder: Secondary | ICD-10-CM

## 2022-05-26 ENCOUNTER — Encounter: Payer: Self-pay | Admitting: Orthopedic Surgery

## 2022-05-26 ENCOUNTER — Telehealth: Payer: Self-pay

## 2022-05-26 ENCOUNTER — Ambulatory Visit (INDEPENDENT_AMBULATORY_CARE_PROVIDER_SITE_OTHER): Payer: Medicare PPO | Admitting: Orthopedic Surgery

## 2022-05-26 DIAGNOSIS — Z96611 Presence of right artificial shoulder joint: Secondary | ICD-10-CM

## 2022-05-26 NOTE — Telephone Encounter (Signed)
Pls make sure appt scheduled per Dr August Saucer

## 2022-05-26 NOTE — Telephone Encounter (Signed)
-----   Message from Cammy Copa, MD sent at 05/26/2022  1:02 PM EDT ----- Pls have her f/u luke 6 w thx

## 2022-05-26 NOTE — Progress Notes (Signed)
Post-Op Visit Note   Patient: Lindsay Jimenez           Date of Birth: [birth year]           MRN: [id number] Visit Date: 05/26/2022 PCP: Pcp, No   Assessment & Plan:  Chief Complaint:  Chief Complaint  Patient presents with   Right Shoulder - Routine Post Op    right reverse shoulder arthroplasty on 04/10/2022.   Visit Diagnoses: No diagnosis found.  Plan: Lindsay Jimenez is a 72 year old patient is now 6 weeks out right reverse shoulder replacement.  Doing well with no problems.  Sleeping well in the bed.  No medication for pain.  She is doing physical therapy 2 times a week and doing home exercises 2 times a day.  On examination she has range of motion of 30/85/140.  Subscap and infraspinatus strength both 5 out of 5.  Incision looks good.  Plan at this time is 6-week return for final check.  Okay for putting and chipping but I do not think she has got quite enough external rotation yet to do full swing on the golf club.  She has a trip at the end of June and she may get an injection in the left shoulder in the middle of June.  Follow-Up Instructions: No follow-ups on file.   Orders:  No orders of the defined types were placed in this encounter.  No orders of the defined types were placed in this encounter.   Imaging: No results found.  PMFS History: Patient Active Problem List   Diagnosis Date Noted   Arthritis of right shoulder region 05/11/2022   Biceps tendonitis on right 05/11/2022   S/P reverse total shoulder arthroplasty, right 04/10/2022   Status post total left knee replacement 11/19/2018   Unilateral primary osteoarthritis, left knee 08/21/2016   Chronic pain of left knee 08/21/2016   Osteoarthritis of right knee 03/02/2015   Status post total right knee replacement 03/02/2015   Osteoarthritis of right hip 10/20/2014   Status post total replacement of right hip 10/20/2014   Past Medical History:  Diagnosis Date   Arthritis    oa   Breast CA (HCC) 2002   bilateral    Family history of adverse reaction to anesthesia    aunt had problem , not sure what problem  was   GERD (gastroesophageal reflux disease)    occasional   PONV (postoperative nausea and vomiting)     No family history on file.  Past Surgical History:  Procedure Laterality Date   ablation to both knees  april and may 2016   to deaden nerves   CATARACT EXTRACTION Bilateral 2022   COLONOSCOPY     KNEE SURGERY Right 02/11/1968   cartlidge removed   MASTECTOMY Bilateral 02/11/2000   OOPHORECTOMY Bilateral 02/10/2002   TOTAL HIP ARTHROPLASTY Right 10/20/2014   Procedure: RIGHT TOTAL HIP ARTHROPLASTY ANTERIOR APPROACH;  Surgeon: Kathryne Hitch, MD;  Location: [facility];  Service: Orthopedics;  Laterality: Right;   TOTAL KNEE ARTHROPLASTY Right 03/02/2015   Procedure: RIGHT TOTAL KNEE ARTHROPLASTY;  Surgeon: Kathryne Hitch, MD;  Location: [facility];  Service: Orthopedics;  Laterality: Right;   TOTAL KNEE ARTHROPLASTY Left 11/19/2018   Procedure: LEFT TOTAL KNEE ARTHROPLASTY;  Surgeon: Kathryne Hitch, MD;  Location: [facility];  Service: Orthopedics;  Laterality: Left;   TOTAL SHOULDER ARTHROPLASTY Right 04/10/2022   Procedure: RIGHT SHOULDER REPLACEMENT;  Surgeon: Cammy Copa, MD;  Location: [facility] OR;  Service: Orthopedics;  Laterality: Right;   Social History   Occupational History   Not on file  Tobacco Use   Smoking status: Never   Smokeless tobacco: Never  Vaping Use   Vaping Use: Never used  Substance and Sexual Activity   Alcohol use: No   Drug use: No   Sexual activity: Yes

## 2022-07-09 ENCOUNTER — Encounter: Payer: Self-pay | Admitting: Orthopedic Surgery

## 2022-07-09 ENCOUNTER — Ambulatory Visit (INDEPENDENT_AMBULATORY_CARE_PROVIDER_SITE_OTHER): Payer: Medicare PPO | Admitting: Surgical

## 2022-07-09 DIAGNOSIS — Z96611 Presence of right artificial shoulder joint: Secondary | ICD-10-CM

## 2022-07-09 NOTE — Progress Notes (Signed)
Post-Op Visit Note   Patient: Lindsay Jimenez           Date of Birth: May 19, 1950           MRN: [id number] Visit Date: 07/09/2022 PCP: Pcp, No   Assessment & Plan:  Chief Complaint:  Chief Complaint  Patient presents with   Right Shoulder - Follow-up    R-RSA (surgery date 04-10-22)   Visit Diagnoses:  1. History of arthroplasty of right shoulder     Plan: Patient is a 72 year old female who presents s/p right reverse shoulder arthroplasty on 04/10/2022.  She is overall doing great.  No significant pain in the right shoulder.  No symptoms of instability.  She has finished physical therapy on 06/27/2022 and she has transition to home exercise program.  On exam, she has incision is well-healed.  External rotation passively to 40 degrees, abduction passively to 90 degrees and passive forward elevation to 150 degrees.  She is active range of motion equivalent to passive range of motion.  Axillary nerve intact with deltoid firing.  Excellent subscapularis strength.  Plan is to have patient follow-up with the office as needed regarding her right shoulder.  She will follow-up with her left shoulder injection in mid June since she is going to [place] in late June.  She is doing some putting and chipping and golf right now without any issue.  She will slowly increase the distance that she is helping and progressed to full driving about 4 to 5 months out from surgery.  Let us know if there is any issues with this or increasing pain.  Dental antibiotic prophylaxis was counseled as well as lifting restriction.  Follow-Up Instructions: No follow-ups on file.   Orders:  No orders of the defined types were placed in this encounter.  No orders of the defined types were placed in this encounter.   Imaging: No results found.  PMFS History: Patient Active Problem List   Diagnosis Date Noted   Arthritis of right shoulder region 05/11/2022   Biceps tendonitis on right 05/11/2022   S/P reverse  total shoulder arthroplasty, right 04/10/2022   Status post total left knee replacement 11/19/2018   Unilateral primary osteoarthritis, left knee 08/21/2016   Chronic pain of left knee 08/21/2016   Osteoarthritis of right knee 03/02/2015   Status post total right knee replacement 03/02/2015   Osteoarthritis of right hip 10/20/2014   Status post total replacement of right hip 10/20/2014   Past Medical History:  Diagnosis Date   Arthritis    oa   Breast CA (HCC) 2002   bilateral   Family history of adverse reaction to anesthesia    aunt had problem , not sure what problem  was   GERD (gastroesophageal reflux disease)    occasional   PONV (postoperative nausea and vomiting)     No family history on file.  Past Surgical History:  Procedure Laterality Date   ablation to both knees  april and may 2016   to deaden nerves   CATARACT EXTRACTION Bilateral 2022   COLONOSCOPY     KNEE SURGERY Right 02/11/1968   cartlidge removed   MASTECTOMY Bilateral 02/11/2000   OOPHORECTOMY Bilateral 02/10/2002   TOTAL HIP ARTHROPLASTY Right 10/20/2014   Procedure: RIGHT TOTAL HIP ARTHROPLASTY ANTERIOR APPROACH;  Surgeon: Kathryne Hitch, MD;  Location: [facility];  Service: Orthopedics;  Laterality: Right;   TOTAL KNEE ARTHROPLASTY Right 03/02/2015   Procedure: RIGHT TOTAL KNEE ARTHROPLASTY;  Surgeon: Vanita Panda  Magnus Ivan, MD;  Location: [facility];  Service: Orthopedics;  Laterality: Right;   TOTAL KNEE ARTHROPLASTY Left 11/19/2018   Procedure: LEFT TOTAL KNEE ARTHROPLASTY;  Surgeon: Kathryne Hitch, MD;  Location: [facility];  Service: Orthopedics;  Laterality: Left;   TOTAL SHOULDER ARTHROPLASTY Right 04/10/2022   Procedure: RIGHT SHOULDER REPLACEMENT;  Surgeon: Cammy Copa, MD;  Location: [facility];  Service: Orthopedics;  Laterality: Right;   Social History   Occupational History   Not on file  Tobacco Use   Smoking status: Never   Smokeless tobacco: Never  Vaping Use   Vaping  Use: Never used  Substance and Sexual Activity   Alcohol use: No   Drug use: No   Sexual activity: Yes

## 2022-08-01 ENCOUNTER — Ambulatory Visit: Payer: Medicare PPO | Admitting: Orthopedic Surgery

## 2022-08-01 ENCOUNTER — Encounter: Payer: Self-pay | Admitting: Orthopedic Surgery

## 2022-08-01 ENCOUNTER — Other Ambulatory Visit: Payer: Self-pay

## 2022-08-01 DIAGNOSIS — M19012 Primary osteoarthritis, left shoulder: Secondary | ICD-10-CM

## 2022-08-01 DIAGNOSIS — M25512 Pain in left shoulder: Secondary | ICD-10-CM

## 2022-08-01 DIAGNOSIS — G8929 Other chronic pain: Secondary | ICD-10-CM | POA: Diagnosis not present

## 2022-08-01 NOTE — Progress Notes (Unsigned)
Office Visit Note   Patient: Lindsay Jimenez           Date of Birth: 10/01/1950           MRN: [id number] Visit Date: 08/01/2022 Requested by: No referring provider defined for this encounter. PCP: Pcp, No  Subjective: Chief Complaint  Patient presents with   Left Shoulder - Pain    HPI: Lindsay Jimenez is a 72 y.o. female who presents to the office reporting left shoulder pain.  She is doing well from her right reverse shoulder replacement in February.  She would like to have an injection.  Getting ready to go on a trip where she will retrace her family's involvement and travels during [place] War [place] around [place].  Last injection in the shoulder was 6 months ago..                ROS: All systems reviewed are negative as they relate to the chief complaint within the history of present illness.  Patient denies fevers or chills.  Assessment & Plan: Visit Diagnoses:  1. Chronic left shoulder pain     Plan: Impression is left shoulder arthritis which is severe with some limitation of motion.  Right shoulder replacement is doing well.  Ultrasound-guided injection performed today.  She will follow-up as needed.  Follow-Up Instructions: No follow-ups on file.   Orders:  Orders Placed This Encounter  Procedures   US Guided Needle Placement - No Linked Charges   No orders of the defined types were placed in this encounter.     Procedures: Large Joint Inj: L glenohumeral on 08/01/2022 10:03 PM Indications: diagnostic evaluation and pain Details: 22 G 1.5 in needle, ultrasound-guided posterior approach  Arthrogram: No  Medications: 9 mL bupivacaine 0.5 %; 40 mg methylPREDNISolone acetate 40 MG/ML; 5 mL lidocaine 1 % Outcome: tolerated well, no immediate complications Procedure, treatment alternatives, risks and benefits explained, specific risks discussed. Consent was given by the patient. Immediately prior to procedure a time out was called to verify the correct patient,  procedure, equipment, support staff and site/side marked as required. Patient was prepped and draped in the usual sterile fashion.       Clinical Data: No additional findings.  Objective: Vital Signs: There were no vitals taken for this visit.  Physical Exam:  Constitutional: Patient appears well-developed HEENT:  Head: Normocephalic Eyes:EOM are normal Neck: Normal range of motion Cardiovascular: Normal rate Pulmonary/chest: Effort normal Neurologic: Patient is alert Skin: Skin is warm Psychiatric: Patient has normal mood and affect  Ortho Exam: Ortho exam demonstrates range of motion on the left of 30/70/90.  Rotator cuff strength is pretty reasonable to infraspinatus supraspinatus subscap muscle testing.  Motor or sensory function of the hand is intact.  Right shoulder has forward flexion and abduction both above 90 degrees.  Specialty Comments:  No specialty comments available.  Imaging: No results found.   PMFS History: Patient Active Problem List   Diagnosis Date Noted   Arthritis of right shoulder region 05/11/2022   Biceps tendonitis on right 05/11/2022   S/P reverse total shoulder arthroplasty, right 04/10/2022   Status post total left knee replacement 11/19/2018   Unilateral primary osteoarthritis, left knee 08/21/2016   Chronic pain of left knee 08/21/2016   Osteoarthritis of right knee 03/02/2015   Status post total right knee replacement 03/02/2015   Osteoarthritis of right hip 10/20/2014   Status post total replacement of right hip 10/20/2014   Past Medical History:  Diagnosis Date   Arthritis    oa   Breast CA [facility]) 2002   bilateral   Family history of adverse reaction to anesthesia    aunt had problem , not sure what problem  was   GERD (gastroesophageal reflux disease)    occasional   PONV (postoperative nausea and vomiting)     No family history on file.  Past Surgical History:  Procedure Laterality Date   ablation to both knees  april  and may 2016   to deaden nerves   CATARACT EXTRACTION Bilateral 2022   COLONOSCOPY     KNEE SURGERY Right 02/11/1968   cartlidge removed   MASTECTOMY Bilateral 02/11/2000   OOPHORECTOMY Bilateral 02/10/2002   TOTAL HIP ARTHROPLASTY Right 10/20/2014   Procedure: RIGHT TOTAL HIP ARTHROPLASTY ANTERIOR APPROACH;  Surgeon: Kathryne Hitch, MD;  Location: [facility];  Service: Orthopedics;  Laterality: Right;   TOTAL KNEE ARTHROPLASTY Right 03/02/2015   Procedure: RIGHT TOTAL KNEE ARTHROPLASTY;  Surgeon: Kathryne Hitch, MD;  Location: [facility];  Service: Orthopedics;  Laterality: Right;   TOTAL KNEE ARTHROPLASTY Left 11/19/2018   Procedure: LEFT TOTAL KNEE ARTHROPLASTY;  Surgeon: Kathryne Hitch, MD;  Location: [facility];  Service: Orthopedics;  Laterality: Left;   TOTAL SHOULDER ARTHROPLASTY Right 04/10/2022   Procedure: RIGHT SHOULDER REPLACEMENT;  Surgeon: Cammy Copa, MD;  Location: [facility] OR;  Service: Orthopedics;  Laterality: Right;   Social History   Occupational History   Not on file  Tobacco Use   Smoking status: Never   Smokeless tobacco: Never  Vaping Use   Vaping Use: Never used  Substance and Sexual Activity   Alcohol use: No   Drug use: No   Sexual activity: Yes

## 2022-08-02 MED ORDER — BUPIVACAINE HCL 0.5 % IJ SOLN
9.0000 mL | INTRAMUSCULAR | Status: AC | PRN
  Administered 2022-08-01: 9 mL via INTRA_ARTICULAR

## 2022-08-02 MED ORDER — METHYLPREDNISOLONE ACETATE 40 MG/ML IJ SUSP
40.0000 mg | INTRAMUSCULAR | Status: AC | PRN
  Administered 2022-08-01: 40 mg via INTRA_ARTICULAR

## 2022-08-02 MED ORDER — LIDOCAINE HCL 1 % IJ SOLN
5.0000 mL | INTRAMUSCULAR | Status: AC | PRN
  Administered 2022-08-01: 5 mL

## 2022-11-14 ENCOUNTER — Encounter: Payer: Self-pay | Admitting: Orthopedic Surgery

## 2022-11-14 ENCOUNTER — Other Ambulatory Visit (INDEPENDENT_AMBULATORY_CARE_PROVIDER_SITE_OTHER): Payer: Self-pay

## 2022-11-14 ENCOUNTER — Ambulatory Visit: Payer: Medicare PPO | Admitting: Orthopedic Surgery

## 2022-11-14 DIAGNOSIS — G8929 Other chronic pain: Secondary | ICD-10-CM | POA: Diagnosis not present

## 2022-11-14 DIAGNOSIS — M25512 Pain in left shoulder: Secondary | ICD-10-CM

## 2022-11-14 NOTE — Progress Notes (Signed)
Office Visit Note   Patient: Lindsay Jimenez           Date of Birth: 1950-06-07           MRN: [id number] Visit Date: 11/14/2022 Requested by: No referring provider defined for this encounter. PCP: Pcp, No  Subjective: Chief Complaint  Patient presents with   Left Shoulder - Pain    HPI: Lindsay Jimenez is a 72 y.o. female who presents to the office reporting left shoulder pain.  She had a glenohumeral joint injection in January and June of this year which gave her relief for about 6 weeks.  She had to move her appointment up from November due to increased pain.  She does report decreased range of motion as well as anterior pain which is constant.  Hard for her to play golf due to the pain.  Has not really done anything differently or injured the left shoulder.  Denies much in the way of neck pain or numbness and tingling.  Hard for her to get comfortable for sleep.  Robaxin helps.  She is doing well with her right reverse shoulder replacement done in February of this year..                ROS: All systems reviewed are negative as they relate to the chief complaint within the history of present illness.  Patient denies fevers or chills.  Assessment & Plan: Visit Diagnoses:  1. Chronic left shoulder pain     Plan: Impression is left shoulder pain with end-stage arthritis and some weakness.  Plan is left reverse shoulder replacement.  The risk and benefits are discussed with the patient including not limited to infection nerve or vessel damage shoulder instability incomplete pain weak as well as incomplete restoration of function.  Patient understands and wishes to proceed.  She understands the rehabilitative process very well having just undergone it about 5 months ago.  All questions answered  Follow-Up Instructions: No follow-ups on file.   Orders:  Orders Placed This Encounter  Procedures   XR Shoulder Left   CT SHOULDER LEFT WO CONTRAST   No orders of the defined types were  placed in this encounter.     Procedures: No procedures performed   Clinical Data: No additional findings.  Objective: Vital Signs: There were no vitals taken for this visit.  Physical Exam:  Constitutional: Patient appears well-developed HEENT:  Head: Normocephalic Eyes:EOM are normal Neck: Normal range of motion Cardiovascular: Normal rate Pulmonary/chest: Effort normal Neurologic: Patient is alert Skin: Skin is warm Psychiatric: Patient has normal mood and affect  Ortho Exam: Ortho exam demonstrates passive range of motion on the left of 20/60/100.  Deltoid does fire.  Rotator cuff strength 5- out of 5 to external rotation 5+ out of 5 in the subscap.  Patient does have tenderness of the bicipital tendon in the bicipital groove along with positive O'Brien's testing.  No discrete AC joint tenderness is present.  No other masses lymphadenopathy or skin changes noted in that shoulder girdle region.  Specialty Comments:  No specialty comments available.  Imaging: XR Shoulder Left  Result Date: 11/14/2022 AP axillary outlet radiographs left shoulder reviewed.  End-stage bone-on-bone arthritis is present in the glenohumeral joint with some posterior erosion.  This is mild.  Acromiohumeral distance appears maintained.  Visualized lung fields clear.  No acute fracture    PMFS History: Patient Active Problem List   Diagnosis Date Noted   Arthritis of right shoulder  region 05/11/2022   Biceps tendonitis on right 05/11/2022   S/P reverse total shoulder arthroplasty, right 04/10/2022   Status post total left knee replacement 11/19/2018   Unilateral primary osteoarthritis, left knee 08/21/2016   Chronic pain of left knee 08/21/2016   Osteoarthritis of right knee 03/02/2015   Status post total right knee replacement 03/02/2015   Osteoarthritis of right hip 10/20/2014   Status post total replacement of right hip 10/20/2014   Past Medical History:  Diagnosis Date   Arthritis     oa   Breast CA [facility]) 2002   bilateral   Family history of adverse reaction to anesthesia    aunt had problem , not sure what problem  was   GERD (gastroesophageal reflux disease)    occasional   PONV (postoperative nausea and vomiting)     History reviewed. No pertinent family history.  Past Surgical History:  Procedure Laterality Date   ablation to both knees  april and may 2016   to deaden nerves   CATARACT EXTRACTION Bilateral 2022   COLONOSCOPY     KNEE SURGERY Right 02/11/1968   cartlidge removed   MASTECTOMY Bilateral 02/11/2000   OOPHORECTOMY Bilateral 02/10/2002   TOTAL HIP ARTHROPLASTY Right 10/20/2014   Procedure: RIGHT TOTAL HIP ARTHROPLASTY ANTERIOR APPROACH;  Surgeon: Kathryne Hitch, MD;  Location: [facility];  Service: Orthopedics;  Laterality: Right;   TOTAL KNEE ARTHROPLASTY Right 03/02/2015   Procedure: RIGHT TOTAL KNEE ARTHROPLASTY;  Surgeon: Kathryne Hitch, MD;  Location: [facility];  Service: Orthopedics;  Laterality: Right;   TOTAL KNEE ARTHROPLASTY Left 11/19/2018   Procedure: LEFT TOTAL KNEE ARTHROPLASTY;  Surgeon: Kathryne Hitch, MD;  Location: [facility];  Service: Orthopedics;  Laterality: Left;   TOTAL SHOULDER ARTHROPLASTY Right 04/10/2022   Procedure: RIGHT SHOULDER REPLACEMENT;  Surgeon: Cammy Copa, MD;  Location: [facility] OR;  Service: Orthopedics;  Laterality: Right;   Social History   Occupational History   Not on file  Tobacco Use   Smoking status: Never   Smokeless tobacco: Never  Vaping Use   Vaping status: Never Used  Substance and Sexual Activity   Alcohol use: No   Drug use: No   Sexual activity: Yes

## 2022-11-18 ENCOUNTER — Ambulatory Visit
Admission: RE | Admit: 2022-11-18 | Discharge: 2022-11-18 | Disposition: A | Discharge status: Home / Self Care | Payer: Medicare PPO | Source: Ambulatory Visit | Attending: Orthopedic Surgery | Admitting: Orthopedic Surgery

## 2022-11-18 DIAGNOSIS — G8929 Other chronic pain: Secondary | ICD-10-CM

## 2022-12-09 NOTE — Addendum Note (Signed)
Encounter addended by: Roanna Epley on: 12/09/2022 2:57 PM  Actions taken: Imaging Exam ended

## 2022-12-09 NOTE — Addendum Note (Signed)
Encounter addended by: Roanna Epley on: 12/09/2022 2:58 PM  Actions taken: Imaging Exam ended

## 2022-12-24 ENCOUNTER — Telehealth: Payer: Self-pay | Admitting: Orthopedic Surgery

## 2022-12-25 NOTE — Telephone Encounter (Signed)
error 

## 2022-12-30 NOTE — Pre-Procedure Instructions (Signed)
Surgical Instructions   Your procedure is scheduled on January 13, 2023. Report to [facility] Main Entrance "A" at 7:10 A.M., then check in with the Admitting office. Any questions or running late day of surgery: call [phone]  Questions prior to your surgery date: call [phone], Monday-Friday, 8am-4pm. If you experience any cold or flu symptoms such as cough, fever, chills, shortness of breath, etc. between now and your scheduled surgery, please notify us at the above number.     Remember:  Do not eat after midnight the night before your surgery  You may drink clear liquids until 6:10 AM the morning of your surgery.   Clear liquids allowed are: Water, Non-Citrus Juices (without pulp), Carbonated Beverages, Clear Tea (no milk, honey, etc.), Black Coffee Only (NO MILK, CREAM OR POWDERED CREAMER of any kind), and Gatorade.  Patient Instructions  The night before surgery:  No food after midnight. ONLY clear liquids after midnight  The day of surgery (if you do NOT have diabetes):  Drink ONE (1) Pre-Surgery Clear Ensure by 6:10 AM the morning of surgery. Drink in one sitting. Do not sip.  This drink was given to you during your hospital  pre-op appointment visit.  Nothing else to drink after completing the  Pre-Surgery Clear Ensure.         If you have questions, please contact your surgeon's office.    Take these medicines the morning of surgery with A SIP OF WATER: acetaminophen (TYLENOL)    May take these medicines IF NEEDED: famotidine (PEPCID)  fluticasone (FLONASE) nasal spray  methocarbamol (ROBAXIN)  Polyethyl Glycol-Propyl Glycol (SYSTANE OP) eye drops TYRVAYA nasal spray   One week prior to surgery, STOP taking any Aspirin (unless otherwise instructed by your surgeon) Aleve, Naproxen, Ibuprofen, Motrin, Advil, Goody's, BC's, all herbal medications, fish oil, and non-prescription vitamins.                     Do NOT Smoke (Tobacco/Vaping) for 24 hours  prior to your procedure.  If you use a CPAP at night, you may bring your mask/headgear for your overnight stay.   You will be asked to remove any contacts, glasses, piercing's, hearing aid's, dentures/partials prior to surgery. Please bring cases for these items if needed.    Patients discharged the day of surgery will not be allowed to drive home, and someone needs to stay with them for 24 hours.  SURGICAL WAITING ROOM VISITATION Patients may have no more than 2 support people in the waiting area - these visitors may rotate.   Pre-op nurse will coordinate an appropriate time for 1 ADULT support person, who may not rotate, to accompany patient in pre-op.  Children under the age of 57 must have an adult with them who is not the patient and must remain in the main waiting area with an adult.  If the patient needs to stay at the hospital during part of their recovery, the visitor guidelines for inpatient rooms apply.  Please refer to the [facility] website for the visitor guidelines for any additional information.   If you received a COVID test during your pre-op visit  it is requested that you wear a mask when out in public, stay away from anyone that may not be feeling well and notify your surgeon if you develop symptoms. If you have been in contact with anyone that has tested positive in the last 10 days please notify you surgeon.      Pre-operative 5  CHG Bathing Instructions   You can play a key role in reducing the risk of infection after surgery. Your skin needs to be as free of germs as possible. You can reduce the number of germs on your skin by washing with CHG (chlorhexidine gluconate) soap before surgery. CHG is an antiseptic soap that kills germs and continues to kill germs even after washing.   DO NOT use if you have an allergy to chlorhexidine/CHG or antibacterial soaps. If your skin becomes reddened or irritated, stop using the CHG and notify one of our RNs at [phone].    Please shower with the CHG soap starting 4 days before surgery using the following schedule:     Please keep in mind the following:  DO NOT shave, including legs and underarms, starting the day of your first shower.   You may shave your face at any point before/day of surgery.  Place clean sheets on your bed the day you start using CHG soap. Use a clean washcloth (not used since being washed) for each shower. DO NOT sleep with pets once you start using the CHG.   CHG Shower Instructions:  Wash your face and private area with normal soap. If you choose to wash your hair, wash first with your normal shampoo.  After you use shampoo/soap, rinse your hair and body thoroughly to remove shampoo/soap residue.  Turn the water OFF and apply about 3 tablespoons (45 ml) of CHG soap to a CLEAN washcloth.  Apply CHG soap ONLY FROM YOUR NECK DOWN TO YOUR TOES (washing for 3-5 minutes)  DO NOT use CHG soap on face, private areas, open wounds, or sores.  Pay special attention to the area where your surgery is being performed.  If you are having back surgery, having someone wash your back for you may be helpful. Wait 2 minutes after CHG soap is applied, then you may rinse off the CHG soap.  Pat dry with a clean towel  Put on clean clothes/pajamas   If you choose to wear lotion, please use ONLY the CHG-compatible lotions on the back of this paper.   Additional instructions for the day of surgery: DO NOT APPLY any lotions, deodorants, cologne, or perfumes.   Do not bring valuables to the hospital. [facility] is not responsible for any belongings/valuables. Do not wear nail polish, gel polish, artificial nails, or any other type of covering on natural nails (fingers and toes) Do not wear jewelry or makeup Put on clean/comfortable clothes.  Please brush your teeth.  Ask your nurse before applying any prescription medications to the skin.     CHG Compatible Lotions   Aveeno Moisturizing lotion   Cetaphil Moisturizing Cream  Cetaphil Moisturizing Lotion  Clairol Herbal Essence Moisturizing Lotion, Dry Skin  Clairol Herbal Essence Moisturizing Lotion, Extra Dry Skin  Clairol Herbal Essence Moisturizing Lotion, Normal Skin  Curel Age Defying Therapeutic Moisturizing Lotion with Alpha Hydroxy  Curel Extreme Care Body Lotion  Curel Soothing Hands Moisturizing Hand Lotion  Curel Therapeutic Moisturizing Cream, Fragrance-Free  Curel Therapeutic Moisturizing Lotion, Fragrance-Free  Curel Therapeutic Moisturizing Lotion, Original Formula  Eucerin Daily Replenishing Lotion  Eucerin Dry Skin Therapy Plus Alpha Hydroxy Crme  Eucerin Dry Skin Therapy Plus Alpha Hydroxy Lotion  Eucerin Original Crme  Eucerin Original Lotion  Eucerin Plus Crme Eucerin Plus Lotion  Eucerin TriLipid Replenishing Lotion  Keri Anti-Bacterial Hand Lotion  Keri Deep Conditioning Original Lotion Dry Skin Formula Softly Scented  Keri Deep Conditioning Original Lotion, Fragrance Free  Sensitive Skin Formula  Keri Lotion Fast Absorbing Fragrance Free Sensitive Skin Formula  Keri Lotion Fast Absorbing Softly Scented Dry Skin Formula  Keri Original Lotion  Keri Skin Renewal Lotion Keri Silky Smooth Lotion  Keri Silky Smooth Sensitive Skin Lotion  Nivea Body Creamy Conditioning Oil  Nivea Body Extra Enriched Teacher, adult education Moisturizing Lotion Nivea Crme  Nivea Skin Firming Lotion  NutraDerm 30 Skin Lotion  NutraDerm Skin Lotion  NutraDerm Therapeutic Skin Cream  NutraDerm Therapeutic Skin Lotion  ProShield Protective Hand Cream  Provon moisturizing lotion   [facility]- Preparing for Total Shoulder Arthroplasty  Before surgery, you can play an important role. Because skin is not sterile, your skin needs to be as free of germs as possible. You can reduce the number of germs on your skin by using the following products.   Benzoyl Peroxide Gel  o Reduces the number of  germs present on the skin  o Applied twice a day to shoulder area starting two days before surgery   Chlorhexidine Gluconate (CHG) Soap (instructions listed above on how to wash with CHG Soap)  o An antiseptic cleaner that kills germs and bonds with the skin to continue killing germs even after washing  o Used for showering the night before surgery and morning of surgery   ==================================================================  Please follow these instructions carefully:  BENZOYL PEROXIDE 5% GEL  Please do not use if you have an allergy to benzoyl peroxide. If your skin becomes reddened/irritated stop using the benzoyl peroxide.  Starting two days before surgery, apply as follows:  1. Apply benzoyl peroxide in the morning and at night. Apply after taking a shower. If you are not taking a shower clean entire shoulder front, back, and side along with the armpit with a clean wet washcloth.  2. Place a quarter-sized dollop on your SHOULDER and rub in thoroughly, making sure to cover the front, back, and side of your shoulder, along with the armpit.   2 Days prior to Surgery First Dose on _____________ Morning Second Dose on ______________ Night  Day Before Surgery First Dose on ______________ Morning Night before surgery wash (entire body except face and private areas) with CHG Soap THEN Second Dose on ____________ Night   Morning of Surgery  wash BODY AGAIN with CHG Soap   4. Do NOT apply benzoyl peroxide gel on the day of surgery    Please read over the following fact sheets that you were given.

## 2022-12-31 ENCOUNTER — Encounter (HOSPITAL_COMMUNITY): Payer: Self-pay

## 2022-12-31 ENCOUNTER — Inpatient Hospital Stay (HOSPITAL_COMMUNITY)
Admission: RE | Admit: 2022-12-31 | Discharge: 2022-12-31 | Disposition: A | Discharge status: Home / Self Care | Payer: Medicare PPO | Source: Ambulatory Visit | Attending: Orthopedic Surgery

## 2022-12-31 ENCOUNTER — Other Ambulatory Visit: Payer: Self-pay

## 2022-12-31 VITALS — BP 144/77 | HR 93 | Temp 99.1°F | Resp 17 | Ht 67.0 in | Wt 210.4 lb

## 2022-12-31 DIAGNOSIS — Z01818 Encounter for other preprocedural examination: Secondary | ICD-10-CM

## 2022-12-31 DIAGNOSIS — Z01812 Encounter for preprocedural laboratory examination: Secondary | ICD-10-CM | POA: Diagnosis present

## 2022-12-31 LAB — CBC
HCT: 44 % (ref 36.0–46.0)
Hemoglobin: 14.7 g/dL (ref 12.0–15.0)
MCH: 29.6 pg (ref 26.0–34.0)
MCHC: 33.4 g/dL (ref 30.0–36.0)
MCV: 88.5 fL (ref 80.0–100.0)
Platelets: 274 10*3/uL (ref 150–400)
RBC: 4.97 MIL/uL (ref 3.87–5.11)
RDW: 12 % (ref 11.5–15.5)
WBC: 6.9 10*3/uL (ref 4.0–10.5)
nRBC: 0 % (ref 0.0–0.2)

## 2022-12-31 LAB — URINALYSIS, W/ REFLEX TO CULTURE (INFECTION SUSPECTED)
Bacteria, UA: NONE SEEN
Bilirubin Urine: NEGATIVE
Glucose, UA: NEGATIVE mg/dL
Hgb urine dipstick: NEGATIVE
Ketones, ur: NEGATIVE mg/dL
Leukocytes,Ua: NEGATIVE
Nitrite: NEGATIVE
Protein, ur: NEGATIVE mg/dL
Specific Gravity, Urine: 1.021 (ref 1.005–1.030)
pH: 6 (ref 5.0–8.0)

## 2022-12-31 LAB — BASIC METABOLIC PANEL
Anion gap: 8 (ref 5–15)
BUN: 15 mg/dL (ref 8–23)
CO2: 27 mmol/L (ref 22–32)
Calcium: 10 mg/dL (ref 8.9–10.3)
Chloride: 103 mmol/L (ref 98–111)
Creatinine, Ser: 0.74 mg/dL (ref 0.44–1.00)
GFR, Estimated: >60 mL/min (ref >60)
Glucose, Bld: 85 mg/dL (ref 70–99)
Potassium: 4.1 mmol/L (ref 3.5–5.1)
Sodium: 138 mmol/L (ref 135–145)

## 2022-12-31 LAB — SURGICAL PCR SCREEN
MRSA, PCR: NEGATIVE
Staphylococcus aureus: NEGATIVE

## 2022-12-31 NOTE — Progress Notes (Signed)
PCP - Dr. Laurence Spates  Cardiologist - Denies   PPM/ICD - Denies Device Orders - n/a Rep Notified - n/a   Chest x-ray - n/a EKG - Denies Stress Test - Denies ECHO - Denies Cardiac Cath - Denies   Sleep Study - Denies CPAP - n/a   No DM   Last dose of GLP1 agonist- n/a GLP1 instructions: n/a   Blood Thinner Instructions: n/a Aspirin Instructions: n/a   ERAS Protcol - Clear liquids until 0610 morning of surgery PRE-SURGERY Ensure or G2- Ensure given to pt with instructions   COVID TEST- n/a     Anesthesia review: No     Patient denies shortness of breath, fever, cough and chest pain at PAT appointment. Pt denies any respiratory illness/infection in the last two months.      All instructions explained to the patient, with a verbal understanding of the material. Patient agrees to go over the instructions while at home for a better understanding. Patient also instructed to self quarantine after being tested for COVID-19. The opportunity to ask questions was provided.

## 2023-01-02 ENCOUNTER — Ambulatory Visit: Payer: Medicare PPO | Admitting: Orthopedic Surgery

## 2023-01-13 ENCOUNTER — Other Ambulatory Visit: Payer: Self-pay

## 2023-01-13 ENCOUNTER — Ambulatory Visit (HOSPITAL_COMMUNITY): Payer: Medicare PPO | Admitting: Anesthesiology

## 2023-01-13 ENCOUNTER — Observation Stay (HOSPITAL_COMMUNITY)
Admission: RE | Admit: 2023-01-13 | Discharge: 2023-01-14 | Disposition: A | Discharge status: Home / Self Care | Payer: Medicare PPO | Attending: Orthopedic Surgery | Admitting: Orthopedic Surgery

## 2023-01-13 ENCOUNTER — Observation Stay (HOSPITAL_COMMUNITY): Payer: Medicare PPO

## 2023-01-13 ENCOUNTER — Encounter (HOSPITAL_COMMUNITY): Payer: Self-pay | Admitting: Orthopedic Surgery

## 2023-01-13 ENCOUNTER — Encounter (HOSPITAL_COMMUNITY)
Admission: RE | Disposition: A | Discharge status: Home / Self Care | Payer: Self-pay | Source: Home / Self Care | Attending: Orthopedic Surgery

## 2023-01-13 DIAGNOSIS — Z96653 Presence of artificial knee joint, bilateral: Secondary | ICD-10-CM | POA: Insufficient documentation

## 2023-01-13 DIAGNOSIS — Z853 Personal history of malignant neoplasm of breast: Secondary | ICD-10-CM | POA: Diagnosis not present

## 2023-01-13 DIAGNOSIS — Z96612 Presence of left artificial shoulder joint: Secondary | ICD-10-CM

## 2023-01-13 DIAGNOSIS — Z96611 Presence of right artificial shoulder joint: Secondary | ICD-10-CM | POA: Diagnosis not present

## 2023-01-13 DIAGNOSIS — Z96641 Presence of right artificial hip joint: Secondary | ICD-10-CM | POA: Diagnosis not present

## 2023-01-13 DIAGNOSIS — M19012 Primary osteoarthritis, left shoulder: Secondary | ICD-10-CM

## 2023-01-13 DIAGNOSIS — M19019 Primary osteoarthritis, unspecified shoulder: Secondary | ICD-10-CM | POA: Diagnosis present

## 2023-01-13 DIAGNOSIS — Z01818 Encounter for other preprocedural examination: Principal | ICD-10-CM

## 2023-01-13 HISTORY — PX: REVERSE SHOULDER ARTHROPLASTY: SHX5054

## 2023-01-13 HISTORY — PX: BICEPT TENODESIS: SHX5116

## 2023-01-13 SURGERY — ARTHROPLASTY, SHOULDER, TOTAL, REVERSE
Anesthesia: General | Site: Shoulder | Laterality: Left

## 2023-01-13 MED ORDER — PROPOFOL 10 MG/ML IV BOLUS
INTRAVENOUS | Status: AC
  Filled 2023-01-13: qty 20

## 2023-01-13 MED ORDER — NAPROXEN 250 MG PO TABS
250.0000 mg | ORAL_TABLET | Freq: Two times a day (BID) | ORAL | Status: DC
Start: 2023-01-13 — End: 2023-01-14
  Administered 2023-01-13 – 2023-01-14 (×2): 250 mg via ORAL
  Filled 2023-01-13 (×2): qty 1

## 2023-01-13 MED ORDER — MIDAZOLAM HCL 2 MG/2ML IJ SOLN
INTRAMUSCULAR | Status: DC | PRN
  Administered 2023-01-13: 2 mg via INTRAVENOUS

## 2023-01-13 MED ORDER — TRANEXAMIC ACID-NACL 1000-0.7 MG/100ML-% IV SOLN
1000.0000 mg | INTRAVENOUS | Status: AC
  Administered 2023-01-13: 1000 mg via INTRAVENOUS

## 2023-01-13 MED ORDER — METOCLOPRAMIDE HCL 5 MG/ML IJ SOLN: 5.0000 mg | Freq: Three times a day (TID) | INTRAMUSCULAR | Status: DC | PRN

## 2023-01-13 MED ORDER — OXYCODONE HCL 5 MG/5ML PO SOLN
5.0000 mg | Freq: Once | ORAL | Status: DC | PRN
Start: 2023-01-13 — End: 2023-01-13

## 2023-01-13 MED ORDER — METHOCARBAMOL 1000 MG/10ML IJ SOLN: 500.0000 mg | Freq: Four times a day (QID) | INTRAMUSCULAR | Status: DC | PRN

## 2023-01-13 MED ORDER — IRRISEPT - 450ML BOTTLE WITH 0.05% CHG IN STERILE WATER, USP 99.95% OPTIME
TOPICAL | Status: DC | PRN
  Administered 2023-01-13: 450 mL via TOPICAL

## 2023-01-13 MED ORDER — FENTANYL CITRATE (PF) 100 MCG/2ML IJ SOLN
INTRAMUSCULAR | Status: AC
  Filled 2023-01-13: qty 2

## 2023-01-13 MED ORDER — ALIGN 4 MG PO CAPS: 4.0000 mg | ORAL_CAPSULE | Freq: Every day | ORAL | Status: DC

## 2023-01-13 MED ORDER — CEFAZOLIN SODIUM-DEXTROSE 2-4 GM/100ML-% IV SOLN
INTRAVENOUS | Status: AC
  Filled 2023-01-13: qty 100

## 2023-01-13 MED ORDER — ONDANSETRON HCL 4 MG PO TABS: 4.0000 mg | ORAL_TABLET | Freq: Four times a day (QID) | ORAL | Status: DC | PRN

## 2023-01-13 MED ORDER — ONDANSETRON HCL 4 MG/2ML IJ SOLN: 4.0000 mg | Freq: Once | INTRAMUSCULAR | Status: DC | PRN

## 2023-01-13 MED ORDER — FENTANYL CITRATE (PF) 250 MCG/5ML IJ SOLN
INTRAMUSCULAR | Status: AC
  Filled 2023-01-13: qty 5

## 2023-01-13 MED ORDER — FLUTICASONE PROPIONATE 50 MCG/ACT NA SUSP: 1.0000 | Freq: Every day | NASAL | Status: DC | PRN

## 2023-01-13 MED ORDER — FENTANYL CITRATE (PF) 100 MCG/2ML IJ SOLN
25.0000 ug | INTRAMUSCULAR | Status: DC | PRN
  Administered 2023-01-13: 50 ug via INTRAVENOUS

## 2023-01-13 MED ORDER — POVIDONE-IODINE 7.5 % EX SOLN: Freq: Once | CUTANEOUS | Status: DC

## 2023-01-13 MED ORDER — PROPOFOL 10 MG/ML IV BOLUS
INTRAVENOUS | Status: DC | PRN
  Administered 2023-01-13: 150 mg via INTRAVENOUS

## 2023-01-13 MED ORDER — CEFAZOLIN SODIUM-DEXTROSE 2-4 GM/100ML-% IV SOLN
2.0000 g | Freq: Three times a day (TID) | INTRAVENOUS | Status: AC
  Administered 2023-01-13 – 2023-01-14 (×3): 2 g via INTRAVENOUS
  Filled 2023-01-13 (×3): qty 100

## 2023-01-13 MED ORDER — RISAQUAD PO CAPS
1.0000 | ORAL_CAPSULE | Freq: Every day | ORAL | Status: DC
  Administered 2023-01-13 – 2023-01-14 (×2): 1 via ORAL
  Filled 2023-01-13 (×2): qty 1

## 2023-01-13 MED ORDER — DEXAMETHASONE SODIUM PHOSPHATE 10 MG/ML IJ SOLN
INTRAMUSCULAR | Status: DC | PRN
  Administered 2023-01-13: 4 mg via INTRAVENOUS

## 2023-01-13 MED ORDER — METHOCARBAMOL 500 MG PO TABS
500.0000 mg | ORAL_TABLET | Freq: Four times a day (QID) | ORAL | Status: DC | PRN
  Administered 2023-01-13 – 2023-01-14 (×2): 500 mg via ORAL
  Filled 2023-01-13 (×2): qty 1

## 2023-01-13 MED ORDER — ONDANSETRON HCL 4 MG/2ML IJ SOLN
INTRAMUSCULAR | Status: DC | PRN
  Administered 2023-01-13: 4 mg via INTRAVENOUS

## 2023-01-13 MED ORDER — CHLORHEXIDINE GLUCONATE 0.12 % MT SOLN: 15.0000 mL | Freq: Once | OROMUCOSAL | Status: AC

## 2023-01-13 MED ORDER — OXYCODONE HCL 5 MG PO TABS
5.0000 mg | ORAL_TABLET | ORAL | Status: DC | PRN
  Administered 2023-01-13 – 2023-01-14 (×2): 5 mg via ORAL
  Filled 2023-01-13 (×2): qty 1

## 2023-01-13 MED ORDER — SUGAMMADEX SODIUM 200 MG/2ML IV SOLN
INTRAVENOUS | Status: DC | PRN
  Administered 2023-01-13: 181.4 mg via INTRAVENOUS

## 2023-01-13 MED ORDER — ORAL CARE MOUTH RINSE: 15.0000 mL | Freq: Once | OROMUCOSAL | Status: AC

## 2023-01-13 MED ORDER — HYDROMORPHONE HCL 1 MG/ML IJ SOLN
0.5000 mg | INTRAMUSCULAR | Status: DC | PRN
Start: 2023-01-13 — End: 2023-01-14

## 2023-01-13 MED ORDER — BUPIVACAINE HCL (PF) 0.5 % IJ SOLN
INTRAMUSCULAR | Status: DC | PRN
  Administered 2023-01-13: 15 mL via PERINEURAL

## 2023-01-13 MED ORDER — BUPIVACAINE LIPOSOME 1.3 % IJ SUSP
INTRAMUSCULAR | Status: DC | PRN
Start: 2023-01-13 — End: 2023-01-13
  Administered 2023-01-13: 10 mL via PERINEURAL

## 2023-01-13 MED ORDER — FENTANYL CITRATE (PF) 250 MCG/5ML IJ SOLN
INTRAMUSCULAR | Status: DC | PRN
  Administered 2023-01-13 (×2): 50 ug via INTRAVENOUS

## 2023-01-13 MED ORDER — VANCOMYCIN HCL 1000 MG IV SOLR
INTRAVENOUS | Status: DC | PRN
  Administered 2023-01-13: 1000 mg via TOPICAL

## 2023-01-13 MED ORDER — PHENYLEPHRINE 80 MCG/ML (10ML) SYRINGE FOR IV PUSH (FOR BLOOD PRESSURE SUPPORT)
PREFILLED_SYRINGE | INTRAVENOUS | Status: DC | PRN
  Administered 2023-01-13 (×2): 80 ug via INTRAVENOUS

## 2023-01-13 MED ORDER — CHLORHEXIDINE GLUCONATE 0.12 % MT SOLN
OROMUCOSAL | Status: AC
  Administered 2023-01-13: 15 mL via OROMUCOSAL
  Filled 2023-01-13: qty 15

## 2023-01-13 MED ORDER — MENTHOL 3 MG MT LOZG: 1.0000 | LOZENGE | OROMUCOSAL | Status: DC | PRN

## 2023-01-13 MED ORDER — POVIDONE-IODINE 10 % EX SWAB
2.0000 | Freq: Once | CUTANEOUS | Status: AC
  Administered 2023-01-13: 2 via TOPICAL

## 2023-01-13 MED ORDER — VANCOMYCIN HCL 1000 MG IV SOLR
INTRAVENOUS | Status: AC
Start: 2023-01-13 — End: ?
  Filled 2023-01-13: qty 20

## 2023-01-13 MED ORDER — ROCURONIUM BROMIDE 10 MG/ML (PF) SYRINGE
PREFILLED_SYRINGE | INTRAVENOUS | Status: DC | PRN
  Administered 2023-01-13: 70 mg via INTRAVENOUS

## 2023-01-13 MED ORDER — LIDOCAINE HCL (PF) 2 % IJ SOLN
INTRAMUSCULAR | Status: DC | PRN
Start: 2023-01-13 — End: 2023-01-13
  Administered 2023-01-13: 100 mg via INTRADERMAL

## 2023-01-13 MED ORDER — PHENYLEPHRINE HCL-NACL 20-0.9 MG/250ML-% IV SOLN
INTRAVENOUS | Status: DC | PRN
  Administered 2023-01-13: 10 ug/min via INTRAVENOUS

## 2023-01-13 MED ORDER — PROPOFOL 1000 MG/100ML IV EMUL
INTRAVENOUS | Status: AC
  Filled 2023-01-13: qty 100

## 2023-01-13 MED ORDER — TRANEXAMIC ACID-NACL 1000-0.7 MG/100ML-% IV SOLN
INTRAVENOUS | Status: AC
  Filled 2023-01-13: qty 100

## 2023-01-13 MED ORDER — MIDAZOLAM HCL 2 MG/2ML IJ SOLN
INTRAMUSCULAR | Status: AC
Start: 2023-01-13 — End: ?
  Filled 2023-01-13: qty 2

## 2023-01-13 MED ORDER — CEFAZOLIN SODIUM-DEXTROSE 2-4 GM/100ML-% IV SOLN
2.0000 g | INTRAVENOUS | Status: AC
  Administered 2023-01-13: 2 g via INTRAVENOUS

## 2023-01-13 MED ORDER — POLYETHYLENE GLYCOL 3350 17 G PO PACK: 17.0000 g | PACK | Freq: Every day | ORAL | Status: DC

## 2023-01-13 MED ORDER — OXYCODONE HCL 5 MG PO TABS: 5.0000 mg | ORAL_TABLET | Freq: Once | ORAL | Status: DC | PRN

## 2023-01-13 MED ORDER — LACTATED RINGERS IV SOLN: INTRAVENOUS | Status: DC

## 2023-01-13 MED ORDER — METOCLOPRAMIDE HCL 5 MG PO TABS: 5.0000 mg | ORAL_TABLET | Freq: Three times a day (TID) | ORAL | Status: DC | PRN

## 2023-01-13 MED ORDER — ONDANSETRON HCL 4 MG/2ML IJ SOLN
4.0000 mg | Freq: Four times a day (QID) | INTRAMUSCULAR | Status: DC | PRN
Start: 2023-01-13 — End: 2023-01-14

## 2023-01-13 MED ORDER — 0.9 % SODIUM CHLORIDE (POUR BTL) OPTIME
TOPICAL | Status: DC | PRN
  Administered 2023-01-13 (×4): 1000 mL

## 2023-01-13 MED ORDER — ACETAMINOPHEN 325 MG PO TABS
650.0000 mg | ORAL_TABLET | Freq: Four times a day (QID) | ORAL | Status: DC
  Administered 2023-01-13 – 2023-01-14 (×4): 650 mg via ORAL
  Filled 2023-01-13 (×4): qty 2

## 2023-01-13 SURGICAL SUPPLY — 63 items
ALCOHOL 70% 16 OZ (MISCELLANEOUS) ×1 IMPLANT
BAG COUNTER SPONGE SURGICOUNT (BAG) ×1 IMPLANT
BASEPLATE AUG MED W-TAPER (Plate) IMPLANT
BEARING HUMERAL SHLDER 36M STD (Shoulder) IMPLANT
BIT DRILL 1.1 MINI QC NONSTRL (BIT) IMPLANT
BIT DRILL 2.7 W/STOP DISP (BIT) IMPLANT
BIT DRILL TWIST 2.7 (BIT) IMPLANT
BLADE SAW SGTL 13X75X1.27 (BLADE) ×1 IMPLANT
CHLORAPREP W/TINT 26 (MISCELLANEOUS) ×1 IMPLANT
COOLER ICEMAN CLASSIC (MISCELLANEOUS) ×1 IMPLANT
COVER SURGICAL LIGHT HANDLE (MISCELLANEOUS) ×1 IMPLANT
DRAPE INCISE IOBAN 66X45 STRL (DRAPES) ×1 IMPLANT
DRAPE U-SHAPE 47X51 STRL (DRAPES) ×2 IMPLANT
DRSG AQUACEL AG ADV 3.5X10 (GAUZE/BANDAGES/DRESSINGS) ×1 IMPLANT
ELECT BLADE 4.0 EZ CLEAN MEGAD (MISCELLANEOUS) ×1 IMPLANT
ELECT REM PT RETURN 9FT ADLT (ELECTROSURGICAL) ×1 IMPLANT
ELECTRODE BLDE 4.0 EZ CLN MEGD (MISCELLANEOUS) ×1 IMPLANT
ELECTRODE REM PT RTRN 9FT ADLT (ELECTROSURGICAL) ×1 IMPLANT
GAUZE SPONGE 4X4 12PLY STRL LF (GAUZE/BANDAGES/DRESSINGS) ×1 IMPLANT
GLENOID SPHERE STD STRL 36MM (Orthopedic Implant) IMPLANT
GLOVE BIOGEL PI IND STRL 6.5 (GLOVE) ×1 IMPLANT
GLOVE BIOGEL PI IND STRL 8 (GLOVE) ×1 IMPLANT
GLOVE SURG SYN 8.0 (GLOVE) ×1 IMPLANT
GLOVE SURG SYN 8.0 PF PI (GLOVE) IMPLANT
GOWN STRL REUS W/ TWL LRG LVL3 (GOWN DISPOSABLE) ×1 IMPLANT
GOWN STRL REUS W/ TWL XL LVL3 (GOWN DISPOSABLE) ×1 IMPLANT
GUIDE RSA SHLD BM ROT L SU (ORTHOPEDIC DISPOSABLE SUPPLIES) IMPLANT
HYDROGEN PEROXIDE 16OZ (MISCELLANEOUS) ×1 IMPLANT
JET LAVAGE IRRISEPT WOUND (IRRIGATION / IRRIGATOR) ×1 IMPLANT
KIT BASIN OR (CUSTOM PROCEDURE TRAY) ×1 IMPLANT
KIT TURNOVER KIT B (KITS) ×1 IMPLANT
LAVAGE JET IRRISEPT WOUND (IRRIGATION / IRRIGATOR) ×1 IMPLANT
MANIFOLD NEPTUNE II (INSTRUMENTS) ×1 IMPLANT
NDL SUT 6 .5 CRC .975X.05 MAYO (NEEDLE) IMPLANT
NS IRRIG 1000ML POUR BTL (IV SOLUTION) ×1 IMPLANT
PACK SHOULDER (CUSTOM PROCEDURE TRAY) ×1 IMPLANT
PAD COLD SHLDR WRAP-ON (PAD) ×1 IMPLANT
PIN STEINMANN THREADED TIP (PIN) IMPLANT
PIN THREADED REVERSE (PIN) IMPLANT
REAMER GUIDE BUSHING SURG DISP (MISCELLANEOUS) IMPLANT
REAMER GUIDE W/SCREW AUG (MISCELLANEOUS) IMPLANT
RESTRAINT HEAD UNIVERSAL NS (MISCELLANEOUS) ×1 IMPLANT
RETRIEVER SUT HEWSON (MISCELLANEOUS) ×1 IMPLANT
SCREW BONE STRL 6.5MMX30MM (Screw) IMPLANT
SCREW LOCKING 4.75MMX15MM (Screw) IMPLANT
SCREW LOCKING NS 4.75MMX20MM (Screw) IMPLANT
SHOULDER HUMERAL BEAR 36M STD (Shoulder) ×1 IMPLANT
SLING ARM IMMOBILIZER LRG (SOFTGOODS) ×1 IMPLANT
SOL PREP POV-IOD 4OZ 10% (MISCELLANEOUS) ×1 IMPLANT
SPONGE T-LAP 18X18 ~~LOC~~+RFID (SPONGE) ×1 IMPLANT
STEM HUMERAL STRL 13MMX83MM (Stem) IMPLANT
STRIP CLOSURE SKIN 1/2X4 (GAUZE/BANDAGES/DRESSINGS) ×1 IMPLANT
SUCTION TUBE FRAZIER 10FR DISP (SUCTIONS) ×1 IMPLANT
SUT BROADBAND TAPE 2PK 1.5 (SUTURE) IMPLANT
SUT MNCRL AB 3-0 PS2 18 (SUTURE) ×1 IMPLANT
SUT SILK 2 0 TIES 10X30 (SUTURE) ×1 IMPLANT
SUT VIC AB 0 CT1 27XBRD ANBCTR (SUTURE) ×4 IMPLANT
SUT VIC AB 1 CT1 27XBRD ANBCTR (SUTURE) ×2 IMPLANT
SUT VIC AB 2-0 CT1 TAPERPNT 27 (SUTURE) ×3 IMPLANT
SUT VICRYL 0 UR6 27IN ABS (SUTURE) ×2 IMPLANT
TOWEL GREEN STERILE (TOWEL DISPOSABLE) ×1 IMPLANT
TRAY HUM REV SHOULDER STD +6 (Shoulder) IMPLANT
WATER STERILE IRR 1000ML POUR (IV SOLUTION) ×1 IMPLANT

## 2023-01-13 NOTE — H&P (Signed)
Lindsay Jimenez is an 72 y.o. female.   Chief Complaint: Left shoulder pain HPI: Lindsay Jimenez is a 72 y.o. female who presents  reporting left shoulder pain.  She had a glenohumeral joint injection in January and June of this year which gave her relief for about 6 weeks.  She had to move her appointment up from November due to increased pain.  She does report decreased range of motion as well as anterior pain which is constant.  Hard for her to play golf due to the pain.  Has not really done anything differently or injured the left shoulder.  Denies much in the way of neck pain or numbness and tingling.  Hard for her to get comfortable for sleep.  Robaxin helps.  She is doing well with her right reverse shoulder replacement done in February of this year..    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   Procedure: RIGHT TOTAL HIP ARTHROPLASTY ANTERIOR APPROACH;  Surgeon: Kathryne Hitch, MD;  Location: [facility] ORS;  Service: Orthopedics;  Laterality: Right;   TOTAL KNEE ARTHROPLASTY Right 03/02/2015   Procedure: RIGHT TOTAL KNEE ARTHROPLASTY;  Surgeon: Kathryne Hitch, MD;  Location: [facility];  Service: Orthopedics;  Laterality: Right;   TOTAL KNEE ARTHROPLASTY Left 11/19/2018   Procedure: LEFT TOTAL KNEE ARTHROPLASTY;  Surgeon: Kathryne Hitch, MD;  Location: [facility];  Service: Orthopedics;   Laterality: Left;   TOTAL SHOULDER ARTHROPLASTY Right 04/10/2022   Procedure: RIGHT SHOULDER REPLACEMENT;  Surgeon: Cammy Copa, MD;  Location: [facility] OR;  Service: Orthopedics;  Laterality: Right;    History reviewed. No pertinent family history. Social History:  reports that she has never smoked. She has never used smokeless tobacco. She reports that she does not drink alcohol and does not use drugs.  Allergies:  Allergies  Allergen Reactions   Restasis [Cyclosporine] Hives and Itching    Rash, itching on hands    Xarelto [Rivaroxaban] Itching and Other (See Comments)   Codeine Other (See Comments)    Loopy, "out of it", weak   Latex Dermatitis   Red Dye #40 (Allura Red) Swelling   Tape Dermatitis    Tolerates paper tape, adhesive tape causes dermititis    Medications Prior to Admission  Medication Sig Dispense Refill   acetaminophen (TYLENOL) 500 MG tablet Take 500 mg by mouth 2 (two) times daily.     Calcium Carb-Cholecalciferol (CALCIUM 600 + D PO) Take 1 tablet by mouth 2 (two) times daily.     famotidine (PEPCID) 10 MG tablet Take 10 mg by mouth daily as needed for heartburn or indigestion.     fluticasone (FLONASE) 50 MCG/ACT nasal spray Place 1 spray into both nostrils daily as needed for allergies or rhinitis.     ibuprofen (ADVIL) 200 MG  tablet Take 400 mg by mouth 2 (two) times daily.     methocarbamol (ROBAXIN) 500 MG tablet Take 1 tablet (500 mg total) by mouth every 6 (six) hours as needed for muscle spasms. 60 tablet 0   Multiple Vitamin (MULTIVITAMIN WITH MINERALS) TABS tablet Take 1 tablet by mouth in the morning.     Polyethyl Glycol-Propyl Glycol (SYSTANE OP) Place 1 drop into both eyes 4 (four) times daily as needed (for eye dryness).      Probiotic Product (ALIGN) 4 MG CAPS Take 4 mg by mouth daily.     TYRVAYA 0.03 MG/ACT SOLN Place 1 spray into both nostrils daily as needed (dry eyes).     Misc. Devices MISC Breast prostheses, 1 pair, refill x 3.       No results found for this or any previous visit (from the past 48 hour(s)). No results found.  Review of Systems  Musculoskeletal:  Positive for arthralgias.  All other systems reviewed and are negative.   Blood pressure 134/70, pulse 86, temperature 98.2 F (36.8 C), resp. rate 18, height 5\' 7"  (1.702 m), weight 90.7 kg, SpO2 93%. Physical Exam Vitals reviewed.  HENT:     Head: Normocephalic.     Nose: Nose normal.     Mouth/Throat:     Mouth: Mucous membranes are moist.  Cardiovascular:     Rate and Rhythm: Normal rate.     Pulses: Normal pulses.  Pulmonary:     Effort: Pulmonary effort is normal.  Abdominal:     General: Abdomen is flat.  Musculoskeletal:     Cervical back: Normal range of motion.  Skin:    General: Skin is warm.     Capillary Refill: Capillary refill takes less than 2 seconds.  Neurological:     General: No focal deficit present.     Mental Status: She is alert.  Psychiatric:        Mood and Affect: Mood normal.    Ortho exam demonstrates passive range of motion on the left of 20/60/100.  Deltoid does fire.  Rotator cuff strength 5- out of 5 to external rotation 5+ out of 5 in the subscap.  Patient does have tenderness of the bicipital tendon in the bicipital groove along with positive O'Brien's testing.  No discrete AC joint tenderness is present.  No other masses lymphadenopathy or skin changes noted in that shoulder girdle region.    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 Plan is left reverse shoulder replacement. The risk and benefits are discussed with the patient including not limited to infection nerve or vessel damage shoulder instability incomplete pain weak as well as incomplete restoration of function. Patient understands and wishes to proceed. She understands the rehabilitative process very well having just undergone it about 5 months ago. All questions answered   Burnard Bunting,  MD 01/13/2023, 6:39 AM

## 2023-01-13 NOTE — Anesthesia Procedure Notes (Signed)
Anesthesia Procedure Image    

## 2023-01-13 NOTE — Anesthesia Procedure Notes (Signed)
Anesthesia Regional Block: Interscalene brachial plexus block   Pre-Anesthetic Checklist: , timeout performed,  Correct Patient, Correct Site, Correct Laterality,  Correct Procedure, Correct Position, site marked,  Risks and benefits discussed,  Surgical consent,  Pre-op evaluation,  At surgeon's request and post-op pain management  Laterality: Left  Prep: chloraprep       Needles:  Injection technique: Single-shot  Needle Type: Echogenic Needle     Needle Length: 9cm      Additional Needles:   Procedures:,,,, ultrasound used (permanent image in chart),,    Narrative:  Start time: 01/13/2023 7:19 AM End time: 01/13/2023 7:27 AM Injection made incrementally with aspirations every 5 mL.  Performed by: Personally  Anesthesiologist: Eilene Ghazi, MD  Additional Notes: Patient tolerated the procedure well without complications

## 2023-01-13 NOTE — Anesthesia Preprocedure Evaluation (Signed)
Anesthesia Evaluation  Patient identified by MRN, date of birth, ID band Patient awake    Reviewed: Allergy & Precautions, H&P , NPO status , Patient's Chart, lab work & pertinent test results  History of Anesthesia Complications (+) PONV and history of anesthetic complications  Airway Mallampati: II  TM Distance: >3 FB Neck ROM: Full    Dental no notable dental hx.    Pulmonary neg pulmonary ROS   Pulmonary exam normal breath sounds clear to auscultation       Cardiovascular negative cardio ROS Normal cardiovascular exam Rhythm:Regular Rate:Normal     Neuro/Psych negative neurological ROS  negative psych ROS   GI/Hepatic Neg liver ROS,GERD  ,,  Endo/Other  negative endocrine ROS    Renal/GU negative Renal ROS  negative genitourinary   Musculoskeletal  (+) Arthritis , Osteoarthritis,    Abdominal   Peds negative pediatric ROS (+)  Hematology negative hematology ROS (+)   Anesthesia Other Findings   Reproductive/Obstetrics negative OB ROS                             Anesthesia Physical Anesthesia Plan  ASA: 2  Anesthesia Plan: General   Post-op Pain Management: Regional block*   Induction: Intravenous  PONV Risk Score and Plan: 3 and Ondansetron, Dexamethasone and Treatment may vary due to age or medical condition  Airway Management Planned: Oral ETT  Additional Equipment:   Intra-op Plan:   Post-operative Plan: Extubation in OR  Informed Consent: I have reviewed the patients History and Physical, chart, labs and discussed the procedure including the risks, benefits and alternatives for the proposed anesthesia with the patient or authorized representative who has indicated his/her understanding and acceptance.     Dental advisory given  Plan Discussed with: CRNA and Surgeon  Anesthesia Plan Comments:        Anesthesia Quick Evaluation

## 2023-01-13 NOTE — Transfer of Care (Signed)
Immediate Anesthesia Transfer of Care Note  Patient: Lindsay Jimenez  Procedure(s) Performed: REVERSE SHOULDER ARTHROPLASTY (Left: Shoulder) BICEPS TENODESIS (Left: Shoulder)  Patient Location: PACU  Anesthesia Type:General  Level of Consciousness: drowsy  Airway & Oxygen Therapy: Patient connected to face mask oxygen  Post-op Assessment: Post -op Vital signs reviewed and stable  Post vital signs: stable  Last Vitals:  Vitals Value Taken Time  BP 136/77 01/13/23 1050  Temp 36.3 C 01/13/23 1050  Pulse 88 01/13/23 1056  Resp 15 01/13/23 1056  SpO2 100 % 01/13/23 1056  Vitals shown include unfiled device data.  Last Pain:  Vitals:   01/13/23 0636  PainSc: 5          Complications: There were no known notable events for this encounter.

## 2023-01-13 NOTE — Plan of Care (Signed)
  Problem: Education: Goal: Knowledge of General Education information will improve Description: Including pain rating scale, medication(s)/side effects and non-pharmacologic comfort measures Outcome: Progressing   Problem: Health Behavior/Discharge Planning: Goal: Ability to manage health-related needs will improve Outcome: Progressing   Problem: Clinical Measurements: Goal: Ability to maintain clinical measurements within normal limits will improve Outcome: Progressing Goal: Will remain free from infection Outcome: Progressing Goal: Diagnostic test results will improve Outcome: Progressing Goal: Respiratory complications will improve Outcome: Progressing Goal: Cardiovascular complication will be avoided Outcome: Progressing   Problem: Activity: Goal: Risk for activity intolerance will decrease Outcome: Progressing   Problem: Nutrition: Goal: Adequate nutrition will be maintained Outcome: Progressing   Problem: Coping: Goal: Level of anxiety will decrease Outcome: Progressing   Problem: Elimination: Goal: Will not experience complications related to bowel motility Outcome: Progressing Goal: Will not experience complications related to urinary retention Outcome: Progressing   Problem: Pain Management: Goal: General experience of comfort will improve Outcome: Progressing   Problem: Safety: Goal: Ability to remain free from injury will improve Outcome: Progressing   Problem: Skin Integrity: Goal: Risk for impaired skin integrity will decrease Outcome: Progressing   Problem: Education: Goal: Knowledge of the prescribed therapeutic regimen will improve Outcome: Progressing Goal: Understanding of activity limitations/precautions following surgery will improve Outcome: Progressing Goal: Individualized Educational Video(s) Outcome: Progressing   Problem: Activity: Goal: Ability to tolerate increased activity will improve Outcome: Progressing   Problem: Pain  Management: Goal: Pain level will decrease with appropriate interventions Outcome: Progressing

## 2023-01-13 NOTE — Op Note (Unsigned)
NAME: Lindsay Jimenez, Lindsay Jimenez MEDICAL RECORD NO: [id number] ACCOUNT NO: [id number] DATE OF BIRTH: [birth year] FACILITY: [facility] LOCATION: [facility] PHYSICIAN: Graylin Shiver. 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  PROCEDURE:  Left shoulder reverse shoulder replacement and biceps tenodesis.  COMPONENTS UTILIZED: Include Zimmer medium augmented baseplate with one central compression screw and four peripheral locking screws with mini 13 mm stem, 36 standard glenosphere with 1.5 mm offset superiorly with 40 mm humeral tray, +6 offset with poly  bearing 36 mm.  SURGEON:  Graylin Shiver. August Saucer, MD  ASSISTANT:  Karenann Cai, PA.  INDICATIONS:  The patient is a 72 year old female with severe end-stage left shoulder arthritis who presents for operative management of her arthritis after explanation of risks and benefits.  She has done well with her right reverse shoulder  replacement.  DESCRIPTION OF PROCEDURE:  The patient was brought to the operating room where general endotracheal anesthesia was induced.  Preoperative antibiotics administered.  Timeout was called.  The patient was placed in the beach chair position with the head in  neutral position.  The left shoulder was prescrubbed with hydrogen peroxide followed by alcohol and Betadine, which was allowed to air dry and then ChloraPrep solution.  Collier Flowers was then used to cover the operative field.  Sterile draping was performed.   Timeout was called.  Deltopectoral approach was made.  Cephalic vein mobilized laterally.  Crossing veins ligated.  Subdeltoid and subacromial adhesions were released manually.  Deltoid anterior attachment released to decrease some of the tension on the  deltoid.  The biceps tendon was tenodesed to the pec tendon as well as soft tissue superior to the pec.   The superior portion of the transverse humeral ligament was opened.  Tenodesis was performed with 6-0 Vicryl sutures.  The stump of the tendon was  then cut and tagged and the rotator interval was opened up to the base of the coracoid.  Axillary nerve palpated at this time and protected at all times during the case.  The circumflex vessels were then ligated using silk ligatures.  Subscapularis was  then detached using a 15 blade from the lesser tuberosity.  Circumferential release was performed around to the 7 o'clock position including about 2 cm off the inferior humeral neck of the capsule using a Cobb elevator and sponge.  The patient had  significant tendinosis in the subscapularis tendon as well as supraspinatus tendon.  The head was dislocated.  Brown retractor was placed along with a reverse retractor.  Proximal reaming was performed up to a size 14 and then the head was cut in 30  degrees of retroversion using intramedullary alignment.  The broaching was then performed up to a size 13, which gave a very nice fit in the canal.  Cap was placed.  Posterior and anterior retractors were placed.  Labrum was excised with care being taken  to avoid injury to the axillary nerve.  Bankart lesion created from the 12 o'clock to 6 o'clock position.  Rotator interval release was completed with good subscapularis release and mobility and excursion improved by about 2.5 cm.  Next, with retractors  in position, the patient's specific guide was placed onto the glenoid.  The two guide pins were placed and reaming was performed in accordance with preoperative templating to about 6 mm off the inferior surface of the glenoid.  Next,  reaming for the  augment was performed and a trial was placed with very good bony contact achieved.  Next, the true baseplate was placed with one central compression screw and four peripheral locking screws.  Very good contact and very good compression of the baseplate  was achieved.  Next,  we did trial reduction using the 36 standard glenosphere and the 40 mm humeral tray +6 offset with standard bearing.  This was a very stable construct.  The patient had excellent stability with extension, adduction, as well as a  forward force as well as with internal and external rotation at 90 degrees of abduction.  The construct was "two fingers tight."  Next, the reverse replacement was dislocated requiring moderate retraction.  The humeral stem was removed.  Drill was used  to place drill holes through the lesser tuberosity for reattachment of the subscapularis.  IrriSept solution was then placed into the canal.  The trial 36 standard glenosphere was then removed and the true glenosphere was placed onto the dry [facility] taper.   True stem was then placed after placement of the suture tapes.  Vancomycin powder was placed into the canal prior to placement of the stem.  Re-reduction was performed with the 40-mm baseplate with standard polyethylene and the same stability  parameters were maintained.  The true component was placed onto the dried Morse taper and moderately difficult reduction was achieved with a nice pop with reduction.  Arm was taken through a range of motion and found to have excellent stability.   Thorough irrigation was then performed with 3 liters of irrigating solution.  The subscapularis was then repaired to the lesser tuberosity with the arm in 30 degrees of external rotation.  Overall, the patient achieved about 50 of external rotation with  the subscapularis repaired.  IrriSept solution was then utilized followed by vancomycin powder placed on the implant and then the rotator interval was closed using #1 Vicryl suture with the arm 30 degrees externally rotated.  Axillary nerve again  palpated and was intact.  Next, the Kolbel retractor was removed after subscapularis repair.  Pouring irrigation and IrriSept solution was then utilized followed by vancomycin powder with closure of the  deltopectoral interval using #1 Vicryl suture  followed by interrupted inverted 0 Vicryl suture, 2-0 Vicryl suture and 3-0 Monocryl with Steri-Strips and Aquacel dressing applied.  The patient tolerated the procedure well without immediate complication.  Shoulder sling applied.  Luke's assistance was required at all times for retraction, opening, closing and mobilization of tissue.  His assistance was a medical necessity.   PUS D: 01/13/2023 10:58:22 am T: 01/13/2023 11:23:00 am  JOB: [id number]/ [id number]

## 2023-01-13 NOTE — Anesthesia Postprocedure Evaluation (Signed)
Anesthesia Post Note  Patient: Lindsay Jimenez  Procedure(s) Performed: REVERSE SHOULDER ARTHROPLASTY (Left: Shoulder) BICEPS TENODESIS (Left: Shoulder)     Patient location during evaluation: PACU Anesthesia Type: General Level of consciousness: awake and alert Pain management: pain level controlled Vital Signs Assessment: post-procedure vital signs reviewed and stable Respiratory status: spontaneous breathing, nonlabored ventilation, respiratory function stable and patient connected to nasal cannula oxygen Cardiovascular status: blood pressure returned to baseline and stable Postop Assessment: no apparent nausea or vomiting Anesthetic complications: no  There were no known notable events for this encounter.  Last Vitals:  Vitals:   01/13/23 1215 01/13/23 1238  BP: 130/70 127/79  Pulse: 86 88  Resp: 14 18  Temp: 36.4 C 36.5 C  SpO2: 97% 97%    Last Pain:  Vitals:   01/13/23 1215  PainSc: Asleep                 Ilisa Hayworth S

## 2023-01-13 NOTE — Brief Op Note (Signed)
   01/13/2023  10:58 AM  PATIENT:  TERRE LESAR  72 y.o. female  PRE-OPERATIVE DIAGNOSIS:  left shoulder arthritis, biceps tendinitis  POST-OPERATIVE DIAGNOSIS:  left shoulder arthritis, biceps tendinitis  PROCEDURE:  Procedure(s): REVERSE SHOULDER ARTHROPLASTY BICEPS TENODESIS  SURGEON:  Surgeon(s): Cammy Copa, MD  ASSISTANT: Magnant  ANESTHESIA:   General  EBL: 100 ml    Total I/O In: 700 [I.V.:500; IV Piggyback:200] Out: 100 [Blood:100]  BLOOD ADMINISTERED: none  DRAINS: None  LOCAL MEDICATIONS USED:  none  SPECIMEN:  No Specimen  COUNTS:  YES  TOURNIQUET:  * No tourniquets in log *  DICTATION: .Other Dictation: Dictation Number done  PLAN OF CARE: Admit for overnight observation  PATIENT DISPOSITION:  PACU - hemodynamically stable

## 2023-01-13 NOTE — Anesthesia Procedure Notes (Signed)
Procedure Name: Intubation Date/Time: 01/13/2023 7:51 AM  Performed by: Hessie Diener, CRNAPre-anesthesia Checklist: Patient identified, Emergency Drugs available, Suction available and Patient being monitored Patient Re-evaluated:Patient Re-evaluated prior to induction Oxygen Delivery Method: Circle System Utilized Preoxygenation: Pre-oxygenation with 100% oxygen Induction Type: IV induction Ventilation: Mask ventilation without difficulty Laryngoscope Size: Mac and 3 Grade View: Grade I Tube type: Oral Tube size: 7.0 mm Number of attempts: 1 Airway Equipment and Method: Stylet and Oral airway Placement Confirmation: ETT inserted through vocal cords under direct vision, positive ETCO2 and breath sounds checked- equal and bilateral Tube secured with: Tape Dental Injury: Teeth and Oropharynx as per pre-operative assessment

## 2023-01-14 ENCOUNTER — Other Ambulatory Visit: Payer: Self-pay | Admitting: Surgical

## 2023-01-14 ENCOUNTER — Encounter (HOSPITAL_COMMUNITY): Payer: Self-pay | Admitting: Orthopedic Surgery

## 2023-01-14 DIAGNOSIS — M19012 Primary osteoarthritis, left shoulder: Secondary | ICD-10-CM | POA: Diagnosis not present

## 2023-01-14 MED ORDER — OXYCODONE HCL 5 MG PO TABS: 5.0000 mg | ORAL_TABLET | ORAL | 0 refills | Status: AC | PRN

## 2023-01-14 MED ORDER — POLYETHYLENE GLYCOL 3350 17 G PO PACK: 17.0000 g | PACK | Freq: Every day | ORAL | 0 refills | Status: DC | PRN

## 2023-01-14 MED ORDER — IBUPROFEN 800 MG PO TABS: 800.0000 mg | ORAL_TABLET | Freq: Three times a day (TID) | ORAL | 0 refills | Status: AC | PRN

## 2023-01-14 NOTE — Evaluation (Signed)
Occupational Therapy Evaluation Patient Details Name: Lindsay Jimenez MRN: [id number] DOB: [birth year] Today's Date: 01/14/2023   History of Present Illness Pt is a 72 y/o F s/p L TSA on 12/3. PMH includes GERD, arthritis, breast CA, R TSA, bil TKA, R THA   Clinical Impression   Pt reports ind at baseline with ADLs/functional mobility, pt lives alone but plans to have assist from friends for the first week after d/c. Pt currently needing supervision-mod A for ADLs, mod I for bed mobility and supervision for transfers without AD. Pt educated on therex (elbow/wrist/hand/pendulums), performed with RUE as LUE nerve block still intact. Pt educated on WB precautions, compensatory strategies for ADLs/mobility along with LUE positioning for sleeping and sling wear schedule. Pt familiar with precautions from having R TSA earlier this year, verbalizes and demo's understanding during session. Pt presenting with impairments listed below, will follow acutely. Follow physician recommendations for follow up therapies.      If plan is discharge home, recommend the following: A little help with walking and/or transfers;A lot of help with bathing/dressing/bathroom;Assistance with cooking/housework;Assist for transportation;Help with stairs or ramp for entrance    Functional Status Assessment  Patient has had a recent decline in their functional status and demonstrates the ability to make significant improvements in function in a reasonable and predictable amount of time.  Equipment Recommendations  None recommended by OT (pt has all needed DME)    Recommendations for Other Services       Precautions / Restrictions Precautions Precautions: Shoulder Type of Shoulder Precautions: sling at all times, NWB LUE, ok for pendulums, ok for elbow/wrist/hand AROM, ok for ER 0-30, no AROM of shoulder Shoulder Interventions: Shoulder sling/immobilizer;At all times;Off for dressing/bathing/exercises Precaution Booklet  Issued: Yes (comment) Precaution Comments: provided with shoulder protocol handout, sling positioning, elbow/wrist/hand and pendulum exercises Required Braces or Orthoses: Sling Restrictions Weight Bearing Restrictions: Yes LUE Weight Bearing: Non weight bearing      Mobility Bed Mobility Overal bed mobility: Modified Independent             General bed mobility comments: rolls to R  side, use of rail    Transfers Overall transfer level: Needs assistance Equipment used: None Transfers: Sit to/from Stand Sit to Stand: Supervision                  Balance Overall balance assessment: Mild deficits observed, not formally tested                                         ADL either performed or assessed with clinical judgement   ADL Overall ADL's : Needs assistance/impaired Eating/Feeding: Set up   Grooming: Set up   Upper Body Bathing: Minimal assistance   Lower Body Bathing: Minimal assistance   Upper Body Dressing : Moderate assistance   Lower Body Dressing: Moderate assistance   Toilet Transfer: Supervision/safety   Toileting- Clothing Manipulation and Hygiene: Supervision/safety       Functional mobility during ADLs: Supervision/safety       Vision   Vision Assessment?: No apparent visual deficits     Perception Perception: Not tested       Praxis Praxis: Not tested       Pertinent Vitals/Pain Pain Assessment Pain Assessment: No/denies pain     Extremity/Trunk Assessment Upper Extremity Assessment Upper Extremity Assessment: Difficult to assess due to impaired cognition;Right hand dominant;LUE deficits/detail LUE  Deficits / Details: s/p L TSA, nerve block still intact, can wiggle digits   Lower Extremity Assessment Lower Extremity Assessment: Overall WFL for tasks assessed   Cervical / Trunk Assessment Cervical / Trunk Assessment: Normal   Communication Communication Communication: No apparent difficulties    Cognition Arousal: Alert Behavior During Therapy: WFL for tasks assessed/performed Overall Cognitive Status: Within Functional Limits for tasks assessed                                       General Comments  VSS    Exercises Exercises: Shoulder Shoulder Exercises Pendulum Exercise: PROM, Left, Other (comment) (demo'd) Elbow Flexion: AROM, 5 reps, Right (done on R side for teach back since L has nerve block) Elbow Extension: AROM, 5 reps (done on R side for teach back since L has nerve block) Wrist Flexion: AROM, 5 reps (done on R side for teach back since L has nerve block) Wrist Extension: AROM, 5 reps (done on R side for teach back since L has nerve block) Digit Composite Flexion: AROM, 5 reps (done on R side for teach back since L has nerve block) Composite Extension: AROM, 5 reps (done on R side for teach back since L has nerve block)   Shoulder Instructions Shoulder Instructions Donning/doffing shirt without moving shoulder: Moderate assistance Method for sponge bathing under operated UE: Supervision/safety Donning/doffing sling/immobilizer: Minimal assistance Correct positioning of sling/immobilizer: Patient able to independently direct caregiver Pendulum exercises (written home exercise program): Supervision/safety ROM for elbow, wrist and digits of operated UE: Supervision/safety Sling wearing schedule (on at all times/off for ADL's): Patient able to independently direct caregiver;Supervision/safety Proper positioning of operated UE when showering: Supervision/safety;Patient able to independently direct caregiver Positioning of UE while sleeping: Patient able to independently direct caregiver;Supervision/safety    Home Living Family/patient expects to be discharged to:: Private residence Living Arrangements: Alone Available Help at Discharge: Friend(s);Available 24 hours/day Type of Home: House Home Access: Stairs to enter [employer] of  Steps: 2 Entrance Stairs-Rails: None Home Layout: One level     Bathroom Shower/Tub: Producer, television/film/video: Handicapped height Bathroom Accessibility: Yes   Home Equipment: Agricultural consultant (2 wheels);BSC/3in1;Shower seat - built in;Grab bars - tub/shower;Hand held shower head          Prior Functioning/Environment Prior Level of Function : Independent/Modified Independent;Driving             Mobility Comments: no AD ADLs Comments: independent in ADL and IADL        OT Problem List: Decreased strength;Decreased range of motion;Decreased activity tolerance;Impaired balance (sitting and/or standing);Impaired UE functional use      OT Treatment/Interventions: Self-care/ADL training;Therapeutic exercise;Energy conservation;DME and/or AE instruction;Therapeutic activities;Cognitive remediation/compensation;Visual/perceptual remediation/compensation;Patient/family education;Balance training;Modalities    OT Goals(Current goals can be found in the care plan section) Acute Rehab OT Goals Patient Stated Goal: none stated OT Goal Formulation: With patient Time For Goal Achievement: 01/28/23 Potential to Achieve Goals: Good  OT Frequency: Min 1X/week    Co-evaluation              AM-PAC OT "6 Clicks" Daily Activity     Outcome Measure Help from another person eating meals?: None Help from another person taking care of personal grooming?: None Help from another person toileting, which includes using toliet, bedpan, or urinal?: A Little Help from another person bathing (including washing, rinsing, drying)?: A Little Help from another  person to put on and taking off regular upper body clothing?: A Lot Help from another person to put on and taking off regular lower body clothing?: A Lot 6 Click Score: 18   End of Session Equipment Utilized During Treatment: Other (comment) (Sling) Nurse Communication: Mobility status  Activity Tolerance: Patient tolerated  treatment well Patient left: in bed;with call bell/phone within reach  OT Visit Diagnosis: Unsteadiness on feet (R26.81);Other abnormalities of gait and mobility (R26.89)                Time: 0347-4259 OT Time Calculation (min): 39 min Charges:  OT General Charges $OT Visit: 1 Visit OT Evaluation $OT Eval Moderate Complexity: 1 Mod OT Treatments $Self Care/Home Management : 23-37 mins  Carver Fila, OTD, OTR/L SecureChat Preferred Acute Rehab [phone]   Carver Fila Koonce 01/14/2023, 9:16 AM

## 2023-01-14 NOTE — Progress Notes (Signed)
PT Cancellation Note and Discharge  Patient Details Name: Lindsay Jimenez MRN: [id number] DOB: 1950/09/14   Cancelled Treatment:    Reason Eval/Treat Not Completed: PT screened, no needs identified, will sign off. Discussed pt case with OT who reports pt is currently mobilizing at a supervision to modified independent level and does not require a formal PT evaluation at this time. PT signing off. If needs change, please reconsult.     [name] 01/14/2023, 9:13 AM  Conni Slipper, PT, DPT Acute Rehabilitation Services Secure Chat Preferred Office: [phone]

## 2023-01-14 NOTE — Plan of Care (Signed)

## 2023-01-14 NOTE — Progress Notes (Signed)
Patient awaiting family for discharge home, Patient in no acute distress nor complaints of pain nor discomfort; incision on back is clean, dry and intact; No c/o pain at this time. Room was checked and accounted for all patient's belongings; discharge instructions concerning her medications, incision care, follow up appointment and when to call the doctor as needed were all discussed with patient by RN and she expressed understanding on the instructions given

## 2023-01-14 NOTE — Progress Notes (Signed)
  Subjective: Lindsay Jimenez is a 72 y.o. female s/p left RSA.  They are POD1.  Pt's pain is controlled.  Patient denies any complaints of chest pain, shortness of breath, abdominal pain. Has ambulated without difficulty. Block still in effect  Objective: Vital signs in last 24 hours: Temp:  [97.3 F (36.3 C)-98.7 F (37.1 C)] 98.3 F (36.8 C) (12/04 0745) Pulse Rate:  [78-100] 84 (12/04 0745) Resp:  [14-20] 16 (12/04 0745) BP: (111-136)/(61-79) 112/61 (12/04 0745) SpO2:  [93 %-100 %] 96 % (12/04 0745)  Intake/Output from previous day: 12/03 0701 - 12/04 0700 In: 700 [I.V.:500; IV Piggyback:200] Out: 100 [Blood:100] Intake/Output this shift: No intake/output data recorded.  Exam:  No gross blood or drainage overlying the dressing 2+ radial pulse of the operative extremity Postoperative physical exam somewhat limited by interscalene block but intact EPL. Everything else limited at this time.   Labs: No results for input(s): "HGB" in the last 72 hours. No results for input(s): "WBC", "RBC", "HCT", "PLT" in the last 72 hours. No results for input(s): "NA", "K", "CL", "CO2", "BUN", "CREATININE", "GLUCOSE", "CALCIUM" in the last 72 hours. No results for input(s): "LABPT", "INR" in the last 72 hours.  Assessment/Plan: Pt is POD1 s/p left RSA    -Plan to discharge to home today  -No lifting with the operative arm  -Stay in sling except for showering/sleeping and using CPM machine at home.  No lifting with the operative arm more than 1 to 2 pounds  -Follow-up with Dr. August Saucer in clinic 2 weeks postoperatively     [facility] 01/14/2023, 8:00 AM

## 2023-01-15 NOTE — Discharge Summary (Signed)
Physician Discharge Summary      Patient ID: Lindsay Jimenez MRN: [id number] DOB/AGE: 1950-04-25 72 y.o.  Admit date: 01/13/2023 Discharge date: 01/14/23  Admission Diagnoses:  Principal Problem:   OA (osteoarthritis) of shoulder Active Problems:   S/P reverse total shoulder arthroplasty, left   Discharge Diagnoses:  Same  Surgeries: Procedure(s): REVERSE SHOULDER ARTHROPLASTY BICEPS TENODESIS on 01/13/2023   Consultants:   Discharged Condition: Stable  Hospital Course: Lindsay Jimenez is an 72 y.o. female who was admitted 01/13/2023 with a chief complaint of left shoulder pain, and found to have a diagnosis of left shoulder osteoarthritis.  They were brought to the operating room on 01/13/2023 and underwent the above named procedures.  Pt awoke from anesthesia without complication and was transferred to the floor. On POD1, patient's pain was well-controlled.  No red flag signs or symptoms throughout her stay.  She was able to ambulate and perform well with occupational therapy.  Discharged home on POD 1..  Pt will f/u with Dr. August Saucer in clinic in ~2 weeks.   Antibiotics given:  Anti-infectives (From admission, onward)    Start     Dose/Rate Route Frequency Ordered Stop   01/13/23 1400  ceFAZolin (ANCEF) IVPB 2g/100 mL premix        2 g 200 mL/hr over 30 Minutes Intravenous Every 8 hours 01/13/23 1218 01/14/23 1000   01/13/23 0936  vancomycin (VANCOCIN) powder  Status:  Discontinued          As needed 01/13/23 0936 01/13/23 1047   01/13/23 0630  ceFAZolin (ANCEF) IVPB 2g/100 mL premix        2 g 200 mL/hr over 30 Minutes Intravenous On call to O.R. 01/13/23 4098 01/13/23 0829   01/13/23 0628  ceFAZolin (ANCEF) 2-4 GM/100ML-% IVPB       Note to Pharmacy: Darrick Huntsman: cabinet override      01/13/23 0628 01/13/23 1844     .  Recent vital signs:  Vitals:   01/14/23 0354 01/14/23 0745  BP: 124/66 112/61  Pulse: 78 84  Resp: 20 16  Temp: 98.4 F (36.9 C) 98.3 F  (36.8 C)  SpO2: 96% 96%    Recent laboratory studies:  Results for orders placed or performed during the hospital encounter of 12/31/22  Surgical pcr screen   Specimen: Nasal Mucosa; Nasal Swab  Result Value Ref Range   MRSA, PCR NEGATIVE NEGATIVE   Staphylococcus aureus NEGATIVE NEGATIVE  CBC  Result Value Ref Range   WBC 6.9 4.0 - 10.5 K/uL   RBC 4.97 3.87 - 5.11 MIL/uL   Hemoglobin 14.7 12.0 - 15.0 g/dL   HCT 11.9 14.7 - 82.9 %   MCV 88.5 80.0 - 100.0 fL   MCH 29.6 26.0 - 34.0 pg   MCHC 33.4 30.0 - 36.0 g/dL   RDW 56.2 13.0 - 86.5 %   Platelets 274 150 - 400 K/uL   nRBC 0.0 0.0 - 0.2 %  Basic metabolic panel  Result Value Ref Range   Sodium 138 135 - 145 mmol/L   Potassium 4.1 3.5 - 5.1 mmol/L   Chloride 103 98 - 111 mmol/L   CO2 27 22 - 32 mmol/L   Glucose, Bld 85 70 - 99 mg/dL   BUN 15 8 - 23 mg/dL   Creatinine, Ser 7.84 0.44 - 1.00 mg/dL   Calcium 69.6 8.9 - 29.5 mg/dL   GFR, Estimated >28 >41 mL/min   Anion gap 8 5 - 15  Urinalysis, w/ Reflex  to Culture (Infection Suspected) -Urine, Clean Catch  Result Value Ref Range   Specimen Source URINE, CLEAN CATCH    Color, Urine YELLOW YELLOW   APPearance HAZY (A) CLEAR   Specific Gravity, Urine 1.021 1.005 - 1.030   pH 6.0 5.0 - 8.0   Glucose, UA NEGATIVE NEGATIVE mg/dL   Hgb urine dipstick NEGATIVE NEGATIVE   Bilirubin Urine NEGATIVE NEGATIVE   Ketones, ur NEGATIVE NEGATIVE mg/dL   Protein, ur NEGATIVE NEGATIVE mg/dL   Nitrite NEGATIVE NEGATIVE   Leukocytes,Ua NEGATIVE NEGATIVE   RBC / HPF 0-5 0 - 5 RBC/hpf   WBC, UA 0-5 0 - 5 WBC/hpf   Bacteria, UA NONE SEEN NONE SEEN   Squamous Epithelial / HPF 0-5 0 - 5 /HPF   Mucus PRESENT     Discharge Medications:   Allergies as of 01/14/2023       Reactions   Restasis [cyclosporine] Hives, Itching   Rash, itching on hands   Xarelto [rivaroxaban] Itching, Other (See Comments)   Codeine Other (See Comments)   Loopy, "out of it", weak   Latex Dermatitis   Red Dye  #40 (allura Red) Swelling   Tape Dermatitis   Tolerates paper tape, adhesive tape causes dermititis        Medication List     TAKE these medications    acetaminophen 500 MG tablet Commonly known as: TYLENOL Take 500 mg by mouth 2 (two) times daily.   Align 4 MG Caps Take 4 mg by mouth daily.   CALCIUM 600 + D PO Take 1 tablet by mouth 2 (two) times daily.   famotidine 10 MG tablet Commonly known as: PEPCID Take 10 mg by mouth daily as needed for heartburn or indigestion.   fluticasone 50 MCG/ACT nasal spray Commonly known as: FLONASE Place 1 spray into both nostrils daily as needed for allergies or rhinitis.   ibuprofen 800 MG tablet Commonly known as: ADVIL Take 1 tablet (800 mg total) by mouth every 8 (eight) hours as needed. What changed:  medication strength how much to take when to take this reasons to take this   methocarbamol 500 MG tablet Commonly known as: ROBAXIN Take 1 tablet (500 mg total) by mouth every 6 (six) hours as needed for muscle spasms.   Misc. Devices Misc Breast prostheses, 1 pair, refill x 3.   multivitamin with minerals Tabs tablet Take 1 tablet by mouth in the morning.   oxyCODONE 5 MG immediate release tablet Commonly known as: Oxy IR/ROXICODONE Take 1 tablet (5 mg total) by mouth every 4 (four) hours as needed for moderate pain (pain score 4-6) (pain score 4-6).   SYSTANE OP Place 1 drop into both eyes 4 (four) times daily as needed (for eye dryness).   Tyrvaya 0.03 MG/ACT Soln Generic drug: Varenicline Tartrate Place 1 spray into both nostrils daily as needed (dry eyes).        Diagnostic Studies: DG Shoulder Left Port  Result Date: 01/13/2023 CLINICAL DATA:  Status post left shoulder surgery. EXAM: LEFT SHOULDER COMPARISON:  11/14/2022 FINDINGS: Interval left shoulder prosthesis in satisfactory position and alignment. No fracture or dislocation seen. IMPRESSION: Left shoulder prosthesis in satisfactory position and  alignment. Electronically Signed   By: Beckie Salts M.D.   On: 01/13/2023 16:26    Disposition: Discharge disposition: 01-Home or Self Care       Discharge Instructions     Call MD / Call 911   Complete by: As directed    If  you experience chest pain or shortness of breath, CALL 911 and be transported to the hospital emergency room.  If you develope a fever above 101 F, pus (white drainage) or increased drainage or redness at the wound, or calf pain, call your surgeon's office.   Constipation Prevention   Complete by: As directed    Drink plenty of fluids.  Prune juice may be helpful.  You may use a stool softener, such as Colace (over the counter) 100 mg twice a day.  Use MiraLax (over the counter) for constipation as needed.   Diet - low sodium heart healthy   Complete by: As directed    Discharge instructions   Complete by: As directed    You may shower, dressing is waterproof.  Do not bathe or soak the operative shoulder in a tub, pool.  Use the CPM machine 3 times a day for one hour each time.  No lifting with the operative shoulder. Continue use of the sling.  Follow-up with Dr. August Saucer or Drema Pry in ~2 weeks on your given appointment date.  We will remove your adhesive bandage at that time.    Dental Antibiotics:  In most cases prophylactic antibiotics for Dental procdeures after total joint surgery are not necessary.  Exceptions are as follows:  1. History of prior total joint infection  2. Severely immunocompromised (Organ Transplant, cancer chemotherapy, Rheumatoid biologic meds such as Humera)  3. Poorly controlled diabetes (A1C &gt; 8.0, blood glucose over 200)  If you have one of these conditions, contact your surgeon for an antibiotic prescription, prior to your dental procedure.   Increase activity slowly as tolerated   Complete by: As directed    Post-operative opioid taper instructions:   Complete by: As directed    POST-OPERATIVE OPIOID TAPER  INSTRUCTIONS: It is important to wean off of your opioid medication as soon as possible. If you do not need pain medication after your surgery it is ok to stop day one. Opioids include: Codeine, Hydrocodone(Norco, Vicodin), Oxycodone(Percocet, oxycontin) and hydromorphone amongst others.  Long term and even short term use of opiods can cause: Increased pain response Dependence Constipation Depression Respiratory depression And more.  Withdrawal symptoms can include Flu like symptoms Nausea, vomiting And more Techniques to manage these symptoms Hydrate well Eat regular healthy meals Stay active Use relaxation techniques(deep breathing, meditating, yoga) Do Not substitute Alcohol to help with tapering If you have been on opioids for less than two weeks and do not have pain than it is ok to stop all together.  Plan to wean off of opioids This plan should start within one week post op of your joint replacement. Maintain the same interval or time between taking each dose and first decrease the dose.  Cut the total daily intake of opioids by one tablet each day Next start to increase the time between doses. The last dose that should be eliminated is the evening dose.             SignedKarenann Cai 01/15/2023, 10:17 AM

## 2023-01-28 ENCOUNTER — Other Ambulatory Visit (INDEPENDENT_AMBULATORY_CARE_PROVIDER_SITE_OTHER): Payer: Medicare PPO

## 2023-01-28 ENCOUNTER — Encounter: Payer: Self-pay | Admitting: Surgical

## 2023-01-28 ENCOUNTER — Ambulatory Visit (INDEPENDENT_AMBULATORY_CARE_PROVIDER_SITE_OTHER): Payer: Medicare PPO | Admitting: Surgical

## 2023-01-28 DIAGNOSIS — Z96612 Presence of left artificial shoulder joint: Secondary | ICD-10-CM

## 2023-01-28 DIAGNOSIS — Z96611 Presence of right artificial shoulder joint: Secondary | ICD-10-CM

## 2023-01-28 NOTE — Progress Notes (Signed)
Post-Op Visit Note   Patient: Lindsay Jimenez           Date of Birth: 25-Feb-1950           MRN: [id number] Visit Date: 01/28/2023 PCP: Pcp, No   Assessment & Plan:  Chief Complaint:  Chief Complaint  Patient presents with   Left Shoulder - Routine Post Op    01/13/23 LT RSA   Visit Diagnoses:  1. History of arthroplasty of right shoulder     Plan: Lindsay Jimenez is a 72 y.o. female who presents s/p left reverse shoulder arthroplasty on 01/13/2023.  Patient is doing well and pain is overall controlled.  Using CPM machine as instructed.  Denies any chest pain, SOB, fevers, chills.  No complaint of any instability symptoms.  She is doing home exercise program.  Starts physical therapy on Friday.  Really has no significant pain aside from occasional soreness that she just takes ibuprofen for.  Has not had to take any opioid pain medication since the Saturday after her surgery..    On exam, patient has range of motion 15 degrees external rotation, 75 degrees abduction, 100 degrees forward elevation passively.  Intact EPL, FPL, finger abduction, finger adduction, pronation/supination, bicep, tricep, deltoid of operative extremity.  Axillary nerve intact with deltoid firing.  Incision is healing well without evidence of infection or dehiscence.  Incision was made sure to be covered with Steri-Strips from the proximal to distal aspect of the length of the incision.  2+ radial pulse of the operative extremity  Plan is discontinue sling.  Okay to very lightly lift with the operative extremity but no lifting anything heavier than a coffee cup or cell phone.  Start physical therapy to focus on passive range of motion and active range of motion with deltoid isometrics.  Do not want to externally rotate past 30 degrees to protect subscapularis repair.  Follow-up in 4 weeks for clinical recheck with Dr. August Saucer.  Follow-Up Instructions: No follow-ups on file.   Orders:  Orders Placed This Encounter   Procedures   XR Shoulder Left   No orders of the defined types were placed in this encounter.   Imaging: No results found.  PMFS History: Patient Active Problem List   Diagnosis Date Noted   OA (osteoarthritis) of shoulder 01/13/2023   S/P reverse total shoulder arthroplasty, left 01/13/2023   Arthritis of right shoulder region 05/11/2022   Biceps tendonitis on right 05/11/2022   S/P reverse total shoulder arthroplasty, right 04/10/2022   Status post total left knee replacement 11/19/2018   Unilateral primary osteoarthritis, left knee 08/21/2016   Chronic pain of left knee 08/21/2016   Osteoarthritis of right knee 03/02/2015   Status post total right knee replacement 03/02/2015   Osteoarthritis of right hip 10/20/2014   Status post total replacement of right hip 10/20/2014   Past Medical History:  Diagnosis Date   Arthritis    oa   Breast CA (HCC) 2002   bilateral   Family history of adverse reaction to anesthesia    aunt had problem , not sure what problem  was   GERD (gastroesophageal reflux disease)    occasional   PONV (postoperative nausea and vomiting)     No family history on file.  Past Surgical History:  Procedure Laterality Date   ablation to both knees  april and may 2016   to deaden nerves   BICEPT TENODESIS Left 01/13/2023   Procedure: BICEPS TENODESIS;  Surgeon: Rise Paganini  Lorin Picket, MD;  Location: [facility] OR;  Service: Orthopedics;  Laterality: Left;   CATARACT EXTRACTION Bilateral 2022   COLONOSCOPY     KNEE SURGERY Right 02/11/1968   cartlidge removed   MASTECTOMY Bilateral 02/11/2000   OOPHORECTOMY Bilateral 02/10/2002   REVERSE SHOULDER ARTHROPLASTY Left 01/13/2023   Procedure: REVERSE SHOULDER ARTHROPLASTY;  Surgeon: Cammy Copa, MD;  Location: [facility] OR;  Service: Orthopedics;  Laterality: Left;   TOTAL HIP ARTHROPLASTY Right 10/20/2014   Procedure: RIGHT TOTAL HIP ARTHROPLASTY ANTERIOR APPROACH;  Surgeon: Kathryne Hitch, MD;  Location:  [facility] ORS;  Service: Orthopedics;  Laterality: Right;   TOTAL KNEE ARTHROPLASTY Right 03/02/2015   Procedure: RIGHT TOTAL KNEE ARTHROPLASTY;  Surgeon: Kathryne Hitch, MD;  Location: [facility] ORS;  Service: Orthopedics;  Laterality: Right;   TOTAL KNEE ARTHROPLASTY Left 11/19/2018   Procedure: LEFT TOTAL KNEE ARTHROPLASTY;  Surgeon: Kathryne Hitch, MD;  Location: [facility] ORS;  Service: Orthopedics;  Laterality: Left;   TOTAL SHOULDER ARTHROPLASTY Right 04/10/2022   Procedure: RIGHT SHOULDER REPLACEMENT;  Surgeon: Cammy Copa, MD;  Location: [facility] OR;  Service: Orthopedics;  Laterality: Right;   Social History   Occupational History   Not on file  Tobacco Use   Smoking status: Never   Smokeless tobacco: Never  Vaping Use   Vaping status: Never Used  Substance and Sexual Activity   Alcohol use: No   Drug use: No   Sexual activity: Yes

## 2023-01-31 DIAGNOSIS — M19012 Primary osteoarthritis, left shoulder: Secondary | ICD-10-CM

## 2023-03-04 ENCOUNTER — Ambulatory Visit (INDEPENDENT_AMBULATORY_CARE_PROVIDER_SITE_OTHER): Payer: Medicare PPO | Admitting: Orthopedic Surgery

## 2023-03-04 ENCOUNTER — Encounter: Payer: Self-pay | Admitting: Orthopedic Surgery

## 2023-03-04 DIAGNOSIS — Z96611 Presence of right artificial shoulder joint: Secondary | ICD-10-CM

## 2023-03-04 NOTE — Progress Notes (Signed)
Post-Op Visit Note   Patient: Lindsay Jimenez           Date of Birth: June 08, 1950           MRN: [id number] Visit Date: 03/04/2023 PCP: Pcp, No   Assessment & Plan:  Chief Complaint:  Chief Complaint  Patient presents with   Left Shoulder - Routine Post Op    01/13/23 LT RSA   Visit Diagnoses:  1. History of arthroplasty of right shoulder     Plan: Lindsay Jimenez is a patient is 6 weeks out left reverse shoulder replacement.  She feels like she is making better progress with the left than she did with the right.  Overall she is doing well.  In physical therapy.  Going to the [place] next week.  Taking Tylenol for pain.  On exam she has 5- out of 5 subscap strength with external rotation about 30 and she is getting close to the 90 degrees of abduction and has forward flexion just over 90.  Plan at this time is continue with range of motion exercises.  Follow-up in 6 weeks for clinical recheck.  Follow-Up Instructions: No follow-ups on file.   Orders:  No orders of the defined types were placed in this encounter.  No orders of the defined types were placed in this encounter.   Imaging: No results found.  PMFS History: 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   Arthritis of right shoulder region 05/11/2022   Biceps tendonitis on right 05/11/2022   S/P reverse total shoulder arthroplasty, right 04/10/2022   Status post total left knee replacement 11/19/2018   Unilateral primary osteoarthritis, left knee 08/21/2016   Chronic pain of left knee 08/21/2016   Osteoarthritis of right knee 03/02/2015   Status post total right knee replacement 03/02/2015   Osteoarthritis of right hip 10/20/2014   Status post total replacement of right hip 10/20/2014   Past Medical History:  Diagnosis Date   Arthritis    oa   Breast CA (HCC)  2002   bilateral   Family history of adverse reaction to anesthesia    aunt had problem , not sure what problem  was   GERD (gastroesophageal reflux disease)    occasional   PONV (postoperative nausea and vomiting)     No family history on file.  Past Surgical History:  Procedure Laterality Date   ablation to both knees  april and may 2016   to deaden nerves   BICEPT TENODESIS Left 01/13/2023   Procedure: BICEPS TENODESIS;  Surgeon: Cammy Copa, MD;  Location: [facility] OR;  Service: Orthopedics;  Laterality: Left;   CATARACT EXTRACTION Bilateral 2022   COLONOSCOPY     KNEE SURGERY Right 02/11/1968   cartlidge removed   MASTECTOMY Bilateral 02/11/2000   OOPHORECTOMY Bilateral 02/10/2002   REVERSE SHOULDER ARTHROPLASTY Left 01/13/2023   Procedure: REVERSE SHOULDER ARTHROPLASTY;  Surgeon: Cammy Copa, MD;  Location: [facility] OR;  Service: Orthopedics;  Laterality: Left;   TOTAL HIP ARTHROPLASTY Right 10/20/2014   Procedure: RIGHT TOTAL HIP ARTHROPLASTY ANTERIOR APPROACH;  Surgeon: Kathryne Hitch, MD;  Location: [facility] ORS;  Service: Orthopedics;  Laterality: Right;   TOTAL KNEE ARTHROPLASTY Right 03/02/2015   Procedure: RIGHT TOTAL KNEE ARTHROPLASTY;  Surgeon: Kathryne Hitch, MD;  Location: [facility] ORS;  Service: Orthopedics;  Laterality: Right;  TOTAL KNEE ARTHROPLASTY Left 11/19/2018   Procedure: LEFT TOTAL KNEE ARTHROPLASTY;  Surgeon: Kathryne Hitch, MD;  Location: [facility] ORS;  Service: Orthopedics;  Laterality: Left;   TOTAL SHOULDER ARTHROPLASTY Right 04/10/2022   Procedure: RIGHT SHOULDER REPLACEMENT;  Surgeon: Cammy Copa, MD;  Location: [facility] OR;  Service: Orthopedics;  Laterality: Right;   Social History   Occupational History   Not on file  Tobacco Use   Smoking status: Never   Smokeless tobacco: Never  Vaping Use   Vaping status: Never Used  Substance and Sexual Activity   Alcohol use: No   Drug use: No   Sexual activity: Yes

## 2023-04-13 ENCOUNTER — Encounter: Payer: Self-pay | Admitting: Surgical

## 2023-04-13 ENCOUNTER — Ambulatory Visit (INDEPENDENT_AMBULATORY_CARE_PROVIDER_SITE_OTHER): Payer: Medicare PPO | Admitting: Surgical

## 2023-04-13 DIAGNOSIS — Z96612 Presence of left artificial shoulder joint: Secondary | ICD-10-CM

## 2023-04-13 NOTE — Progress Notes (Signed)
 Post-Op Visit Note   Patient: Lindsay Jimenez           Date of Birth: 09-09-1950           MRN: [id number] Visit Date: 04/13/2023 PCP: Pcp, No   Assessment & Plan:  Chief Complaint:  Chief Complaint  Patient presents with   Left Shoulder - Routine Post Op    01/13/23 LT RSA   Visit Diagnoses:  1. S/P reverse total shoulder arthroplasty, left     Plan: Patient is a 73 year old female who presents s/p left reverse shoulder arthroplasty on 01/13/2023.  Doing great and finished physical therapy last Friday.  She states she really has no pain and just notes occasional soreness at times primarily around the bicep tenodesis site.  She takes Tylenol and ibuprofen with good relief.  On exam, patient has 45 degrees X rotation, 85 degrees abduction, 130 degrees forward elevation passively and actively.  Incision is well-healed.  Axillary nerve intact with deltoid firing.  No Popeye deformity noted.  Excellent strength of bicep flexion and supination without reproduction of pain.  Overall patient is doing very well 3 months out from reverse shoulder replacement.  Her main goal is to return to golf.  Recommend she start with putting and chipping and progressed to pitching in full swing over the course of 4 to 6 weeks.  Would hold off on driver until 4.5 to 5 months out.  Follow-Up Instructions: No follow-ups on file.   Orders:  No orders of the defined types were placed in this encounter.  No orders of the defined types were placed in this encounter.   Imaging: No results found.  PMFS History: 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   Arthritis of right shoulder region 05/11/2022   Biceps tendonitis on right 05/11/2022   S/P reverse total shoulder arthroplasty, right 04/10/2022   Status post total left knee replacement  11/19/2018   Unilateral primary osteoarthritis, left knee 08/21/2016   Chronic pain of left knee 08/21/2016   Osteoarthritis of right knee 03/02/2015   Status post total right knee replacement 03/02/2015   Osteoarthritis of right hip 10/20/2014   Status post total replacement of right hip 10/20/2014   Past Medical History:  Diagnosis Date   Arthritis    oa   Breast CA (HCC) 2002   bilateral   Family history of adverse reaction to anesthesia    aunt had problem , not sure what problem  was   GERD (gastroesophageal reflux disease)    occasional   PONV (postoperative nausea and vomiting)     History reviewed. No pertinent family history.  Past Surgical History:  Procedure Laterality Date   ablation to both knees  april and may 2016   to deaden nerves   BICEPT TENODESIS Left 01/13/2023   Procedure: BICEPS TENODESIS;  Surgeon: Cammy Copa, MD;  Location: [facility] OR;  Service: Orthopedics;  Laterality: Left;   CATARACT EXTRACTION Bilateral 2022   COLONOSCOPY     KNEE SURGERY Right 02/11/1968   cartlidge removed   MASTECTOMY Bilateral 02/11/2000   OOPHORECTOMY Bilateral 02/10/2002   REVERSE SHOULDER ARTHROPLASTY Left 01/13/2023   Procedure: REVERSE SHOULDER ARTHROPLASTY;  Surgeon: Cammy Copa, MD;  Location: [facility] OR;  Service: Orthopedics;  Laterality: Left;   TOTAL HIP ARTHROPLASTY Right 10/20/2014   Procedure: RIGHT  TOTAL HIP ARTHROPLASTY ANTERIOR APPROACH;  Surgeon: Kathryne Hitch, MD;  Location: [facility] ORS;  Service: Orthopedics;  Laterality: Right;   TOTAL KNEE ARTHROPLASTY Right 03/02/2015   Procedure: RIGHT TOTAL KNEE ARTHROPLASTY;  Surgeon: Kathryne Hitch, MD;  Location: [facility] ORS;  Service: Orthopedics;  Laterality: Right;   TOTAL KNEE ARTHROPLASTY Left 11/19/2018   Procedure: LEFT TOTAL KNEE ARTHROPLASTY;  Surgeon: Kathryne Hitch, MD;  Location: [facility] ORS;  Service: Orthopedics;  Laterality: Left;   TOTAL SHOULDER ARTHROPLASTY Right 04/10/2022    Procedure: RIGHT SHOULDER REPLACEMENT;  Surgeon: Cammy Copa, MD;  Location: [facility] OR;  Service: Orthopedics;  Laterality: Right;   Social History   Occupational History   Not on file  Tobacco Use   Smoking status: Never   Smokeless tobacco: Never  Vaping Use   Vaping status: Never Used  Substance and Sexual Activity   Alcohol use: No   Drug use: No   Sexual activity: Yes

## 2023-09-23 ENCOUNTER — Encounter: Payer: Self-pay | Admitting: Sports Medicine

## 2023-09-23 ENCOUNTER — Ambulatory Visit: Admitting: Sports Medicine

## 2023-09-23 ENCOUNTER — Ambulatory Visit: Payer: Self-pay

## 2023-09-23 ENCOUNTER — Ambulatory Visit: Admitting: Orthopaedic Surgery

## 2023-09-23 ENCOUNTER — Other Ambulatory Visit: Payer: Self-pay

## 2023-09-23 DIAGNOSIS — M1612 Unilateral primary osteoarthritis, left hip: Secondary | ICD-10-CM

## 2023-09-23 DIAGNOSIS — M25552 Pain in left hip: Secondary | ICD-10-CM

## 2023-09-23 MED ORDER — LIDOCAINE HCL 1 % IJ SOLN
4.0000 mL | INTRAMUSCULAR | Status: AC | PRN
  Administered 2023-09-23 (×2): 4 mL

## 2023-09-23 MED ORDER — METHYLPREDNISOLONE ACETATE 40 MG/ML IJ SUSP
80.0000 mg | INTRAMUSCULAR | Status: AC | PRN
  Administered 2023-09-23 (×2): 80 mg via INTRA_ARTICULAR

## 2023-09-23 NOTE — Progress Notes (Signed)
   Procedure Note  Patient: SERAH NICOLETTI             Date of Birth: 1950/07/09           MRN: [id number]             Visit Date: 09/23/2023  Procedures: Visit Diagnoses:  1. Unilateral primary osteoarthritis, left hip   2. Pain in left hip    Large Joint Inj: L hip joint on 09/23/2023 10:07 AM Indications: pain Details: 22 G 3.5 in needle, ultrasound-guided anterior approach Medications: 4 mL lidocaine  1 %; 80 mg methylPREDNISolone  acetate 40 MG/ML Outcome: tolerated well, no immediate complications  Procedure: US -guided intra-articular hip injection, Left After discussion on risks/benefits/indications and informed verbal consent was obtained, a timeout was performed. Patient was lying supine on exam table. The hip was cleaned with betadine  and alcohol swabs . Then utilizing ultrasound guidance, the patient's femoral head and neck junction was identified and subsequently injected with 4:2 lidocaine :depomedrol via an in-plane approach with ultrasound visualization of the injectate administered into the hip joint. Patient tolerated procedure well without immediate complications.  Procedure, treatment alternatives, risks and benefits explained, specific risks discussed. Consent was given by the patient. Immediately prior to procedure a time out was called to verify the correct patient, procedure, equipment, support staff and site/side marked as required. Patient was prepped and draped in the usual sterile fashion.     - patient tolerated procedure well, discussed post-injection protocol - follow-up with Dr. Vernetta as indicated; I am happy to see them as needed  Lonell Sprang, DO Primary Care Sports Medicine Physician  [facility] - Orthopedics  This note was dictated using Dragon naturally speaking software and may contain errors in syntax, spelling, or content which have not been identified prior to signing this note.

## 2023-09-23 NOTE — Progress Notes (Signed)
 The patient is well-known to us .  We replaced her right hip.  We replaced both of her knees and she has had her shoulder replaced.  She is a very active 73 year old female and is got back into golf as well.  She has been dealing with worsening left hip and groin pain and stiffness for the last several months.  She walks without assistive device and again she is incredibly active.  Examination of her left hip does show pain in the groin with internal and external rotation as well as flexion.  Her right hip moves smoothly and fluidly.  Standing AP pelvis and lateral left hip when compared to films from 9 years ago shows joint space narrowing as well as osteophytes around the left femoral head both superior at the neck and inferior.  I did go over her x-rays with her and she is developing worsening arthritis with time with the left hip.  She does have a trip in September and then later in December.  She would like to consider a steroid injection in that left hip joint I think this is reasonable as well when talking with her about this and recommending it.  Will see if our partner Dr. Burnetta can place an ultrasound-guided steroid injection in her left hip joint today or soon and then maybe consider another injection in late December before her trip to [place] if it is indicated.  At some point she may end up proceeding with hip replacement surgery but we will try conservative treatment first.  She agrees with this as well.

## 2023-12-14 ENCOUNTER — Encounter: Payer: Self-pay | Admitting: Radiology

## 2023-12-21 ENCOUNTER — Telehealth (HOSPITAL_BASED_OUTPATIENT_CLINIC_OR_DEPARTMENT_OTHER): Payer: Self-pay | Admitting: Sports Medicine

## 2023-12-21 NOTE — Telephone Encounter (Signed)
 Left a message for patient to call the office to Resch her appointment because Dr Burnetta will be out of office.

## 2024-01-25 ENCOUNTER — Ambulatory Visit: Admitting: Sports Medicine

## 2024-01-26 ENCOUNTER — Encounter: Payer: Self-pay | Admitting: Sports Medicine

## 2024-01-26 ENCOUNTER — Other Ambulatory Visit: Payer: Self-pay

## 2024-01-26 ENCOUNTER — Ambulatory Visit: Admitting: Sports Medicine

## 2024-01-26 DIAGNOSIS — Z96641 Presence of right artificial hip joint: Secondary | ICD-10-CM

## 2024-01-26 DIAGNOSIS — G8929 Other chronic pain: Secondary | ICD-10-CM

## 2024-01-26 DIAGNOSIS — M1612 Unilateral primary osteoarthritis, left hip: Secondary | ICD-10-CM

## 2024-01-26 MED ORDER — METHYLPREDNISOLONE ACETATE 40 MG/ML IJ SUSP
80.0000 mg | INTRAMUSCULAR | Status: AC | PRN
  Administered 2024-01-26: 09:00:00 80 mg via INTRA_ARTICULAR

## 2024-01-26 MED ORDER — LIDOCAINE HCL 1 % IJ SOLN
4.0000 mL | INTRAMUSCULAR | Status: AC | PRN
  Administered 2024-01-26: 09:00:00 4 mL

## 2024-01-26 NOTE — Progress Notes (Signed)
 Patient says that she got 2 months of great relief from her last injection. She says that her pain returned gradually after those 2 months, and now feels the same as it did prior to the injection. She is here for repeat injection today.

## 2024-01-26 NOTE — Progress Notes (Signed)
 Lindsay Jimenez - 73 y.o. female MRN [id number]  Date of birth: 05-29-1950  Office Visit Note: Visit Date: 01/26/2024 PCP: Pcp, No Referred by: No ref. provider found  Subjective: Chief Complaint  Patient presents with   Left Hip - Pain   HPI: Lindsay Jimenez is a pleasant 73 y.o. female who presents today for acute on chronic hip pain with OA.  Lindsay Jimenez has no at least moderate hip arthritis.  Back in August she did have ultrasound-guided right hip intra-articular injection which gave her excellent relief for 2 months and then the pain slowly started returning.  She now feels her pain has become exacerbated again.  She is leaving for a trip to [place] with her family on Christmas Day and would like to be feeling well for this.  She uses Tylenol  and ibuprofen  twice daily.  Lindsay Jimenez did replace her right hip, both she and him do not feel she is quite ready for a left hip replacement.  Pertinent ROS were reviewed with the patient and found to be negative unless otherwise specified above in HPI.   Assessment & Plan: Visit Diagnoses:  1. Unilateral primary osteoarthritis, left hip   2. Chronic left hip pain    Plan: Impression is chronic left hip osteoarthritis with acute exacerbation of pain.  Previous x-ray showed a moderate degree of osteoarthritis and previous intra-articular hip injection gave her excellent relief for 2 months before slowly starting to wane in efficacy.  Given her current pain and her upcoming trip to [place], through shared decision making we did repeat ultrasound-guided left hip intra-articular injection, patient tolerated well.  Advised on postinjection protocol.  May use ice/heat as well as either her Tylenol  or ibuprofen  for pain control.  She will follow-up in about 3 months and we will reevaluate how she is doing.  Would plan for updating x-rays somewhere over the next 1-2 years from her previous x-rays to monitor for OA progression.  Follow-up: Return in  about 3 months (around 04/25/2024).   Meds & Orders: No orders of the defined types were placed in this encounter.   Orders Placed This Encounter  Procedures   Large Joint Inj   US  Guided Needle Placement - No Linked Charges     Procedures: Large Joint Inj: L hip joint on 01/26/2024 9:15 AM Indications: pain Details: 22 G 3.5 in needle, ultrasound-guided anterior approach Medications: 4 mL lidocaine  1 %; 80 mg methylPREDNISolone  acetate 40 MG/ML Outcome: tolerated well, no immediate complications  Procedure: US -guided intra-articular hip injection, Left After discussion on risks/benefits/indications and informed verbal consent was obtained, a timeout was performed. Patient was lying supine on exam table. The hip was cleaned with betadine  and alcohol swabs . Then utilizing ultrasound guidance, the patient's femoral head and neck junction was identified and subsequently injected with 4:2 lidocaine :depomedrol via an in-plane approach with ultrasound visualization of the injectate administered into the hip joint. Patient tolerated procedure well without immediate complications.  Procedure, treatment alternatives, risks and benefits explained, specific risks discussed. Consent was given by the patient. Immediately prior to procedure a time out was called to verify the correct patient, procedure, equipment, support staff and site/side marked as required. Patient was prepped and draped in the usual sterile fashion.          Clinical History: No specialty comments available.  She reports that she has never smoked. She has never used smokeless tobacco. No results for input(s): HGBA1C, LABURIC in the last 8760 hours.  Objective:  Physical Exam  Gen: Well-appearing, in no acute distress; non-toxic CV: Well-perfused. Warm.  Resp: Breathing unlabored on room air; no wheezing. Psych: Fluid speech in conversation; appropriate affect; normal thought process  Ortho Exam - Left hip: No  redness swelling or effusion.  There is some discomfort at endrange internal and external rotation. + FADIR test.  Imaging:  **2 view x-ray of the left hip from 09/23/2023 including AP pelvis, left hip frog-leg lateral was independently reviewed and interpreted by myself today.  X-rays demonstrate moderate osteoarthritic change with joint space narrowing as well as osteophytosis of the head and neck.  There is a well-seated right total arthroplasty noted.  Past Medical/Family/Surgical/Social History: Medications & Allergies reviewed per EMR, new medications updated. 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   Arthritis of right shoulder region 05/11/2022   Biceps tendonitis on right 05/11/2022   S/P reverse total shoulder arthroplasty, right 04/10/2022   Status post total left knee replacement 11/19/2018   Unilateral primary osteoarthritis, left knee 08/21/2016   Chronic pain of left knee 08/21/2016   Osteoarthritis of right knee 03/02/2015   Status post total right knee replacement 03/02/2015   Osteoarthritis of right hip 10/20/2014   Status post total replacement of right hip 10/20/2014   Past Medical History:  Diagnosis Date   Arthritis    oa   Breast CA (HCC) 2002   bilateral   Family history of adverse reaction to anesthesia    aunt had problem , not sure what problem  was   GERD (gastroesophageal reflux disease)    occasional   PONV (postoperative nausea and vomiting)    History reviewed. No pertinent family history. Past Surgical History:  Procedure Laterality Date   ablation to both knees  april and may 2016   to deaden nerves   BICEPT TENODESIS Left 01/13/2023   Procedure: BICEPS TENODESIS;  Surgeon: Addie Cordella Hamilton, MD;  Location: [facility] OR;  Service: Orthopedics;  Laterality: Left;   CATARACT EXTRACTION  Bilateral 2022   COLONOSCOPY     KNEE SURGERY Right 02/11/1968   cartlidge removed   MASTECTOMY Bilateral 02/11/2000   OOPHORECTOMY Bilateral 02/10/2002   REVERSE SHOULDER ARTHROPLASTY Left 01/13/2023   Procedure: REVERSE SHOULDER ARTHROPLASTY;  Surgeon: Addie Cordella Hamilton, MD;  Location: [facility] OR;  Service: Orthopedics;  Laterality: Left;   TOTAL HIP ARTHROPLASTY Right 10/20/2014   Procedure: RIGHT TOTAL HIP ARTHROPLASTY ANTERIOR APPROACH;  Surgeon: Lonni CINDERELLA Poli, MD;  Location: [facility] ORS;  Service: Orthopedics;  Laterality: Right;   TOTAL KNEE ARTHROPLASTY Right 03/02/2015   Procedure: RIGHT TOTAL KNEE ARTHROPLASTY;  Surgeon: Lonni CINDERELLA Poli, MD;  Location: [facility] ORS;  Service: Orthopedics;  Laterality: Right;   TOTAL KNEE ARTHROPLASTY Left 11/19/2018   Procedure: LEFT TOTAL KNEE ARTHROPLASTY;  Surgeon: Poli Lonni CINDERELLA, MD;  Location: [facility] OR[facility];  Service: Orthopedics;  Laterality: Left;   TOTAL SHOULDER ARTHROPLASTY Right 04/10/2022   Procedure: RIGHT SHOULDER REPLACEMENT;  Surgeon: Addie Cordella Hamilton, MD;  Location: [facility] OR;  Service: Orthopedics;  Laterality: Right;   Social History   Occupational History   Not on file  Tobacco Use   Smoking status: Never   Smokeless tobacco: Never  Vaping Use   Vaping status: Never Used  Substance and Sexual Activity   Alcohol use: No   Drug use: No   Sexual activity: Yes

## 2024-04-25 ENCOUNTER — Ambulatory Visit: Admitting: Sports Medicine
